# Patient Record
Sex: Male | Born: 1990 | Race: White | Hispanic: No | Marital: Single | Smoking: Current every day smoker
Health system: Southern US, Community
[De-identification: ages and names within clinical notes are randomized; demographics above are authoritative.]

## PROBLEM LIST (undated history)

## (undated) ENCOUNTER — Encounter: Attending: Registered" | Primary: Registered"

## (undated) ENCOUNTER — Ambulatory Visit

## (undated) ENCOUNTER — Encounter

## (undated) ENCOUNTER — Telehealth

## (undated) ENCOUNTER — Telehealth: Attending: Clinical | Primary: Clinical

## (undated) ENCOUNTER — Telehealth: Attending: Registered" | Primary: Registered"

## (undated) ENCOUNTER — Ambulatory Visit: Payer: PRIVATE HEALTH INSURANCE

## (undated) ENCOUNTER — Telehealth: Attending: Otolaryngology | Primary: Otolaryngology

## (undated) ENCOUNTER — Ambulatory Visit: Payer: BLUE CROSS/BLUE SHIELD | Attending: Surgery | Primary: Surgery

## (undated) ENCOUNTER — Ambulatory Visit: Payer: BLUE CROSS/BLUE SHIELD

## (undated) ENCOUNTER — Ambulatory Visit: Attending: Otolaryngology | Primary: Otolaryngology

## (undated) ENCOUNTER — Encounter: Attending: Addiction (Substance Use Disorder) | Primary: Addiction (Substance Use Disorder)

## (undated) ENCOUNTER — Encounter: Attending: Surgery | Primary: Surgery

## (undated) ENCOUNTER — Ambulatory Visit: Payer: PRIVATE HEALTH INSURANCE | Attending: Surgery | Primary: Surgery

## (undated) ENCOUNTER — Ambulatory Visit
Payer: BLUE CROSS/BLUE SHIELD | Attending: Student in an Organized Health Care Education/Training Program | Primary: Student in an Organized Health Care Education/Training Program

## (undated) ENCOUNTER — Encounter: Payer: PRIVATE HEALTH INSURANCE | Attending: Otolaryngology | Primary: Otolaryngology

## (undated) ENCOUNTER — Encounter: Attending: Otolaryngology | Primary: Otolaryngology

## (undated) DIAGNOSIS — M5430 Sciatica, unspecified side: Secondary | ICD-10-CM

## (undated) HISTORY — PX: NO PAST SURGERIES: SHX2092

---
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
## 2012-12-28 ENCOUNTER — Ambulatory Visit: Payer: Self-pay | Admitting: Emergency Medicine

## 2014-01-03 IMAGING — CR DG LUMBAR SPINE COMPLETE 4+V
1 series · 5 of 5 positions shown · non-contrast
Comparison: none

REASON FOR EXAM: back pain left side
COMMENTS:

PROCEDURE:     MDR - M[REDACTED] SPINE WITH OBLIQUES  - December 28, 2012  [DATE]
RESULT:     No acute bony or joint abnormality. Pedicles are intact.
Degenerative changes noted both hips. Linear calcified density noted over
right upper quadrant of questionable etiology.

[Series 1: ap · 0.17mm/px · 5 of 5 slices shown]
[im 1/5]
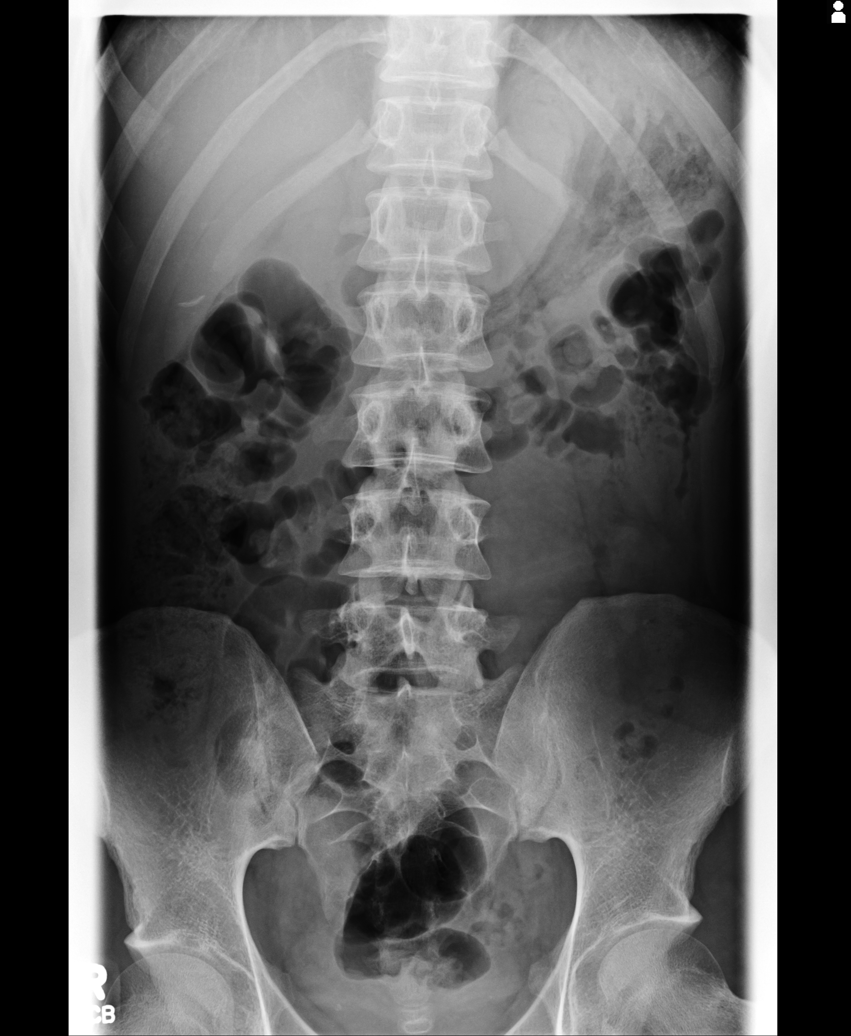
[im 2/5]
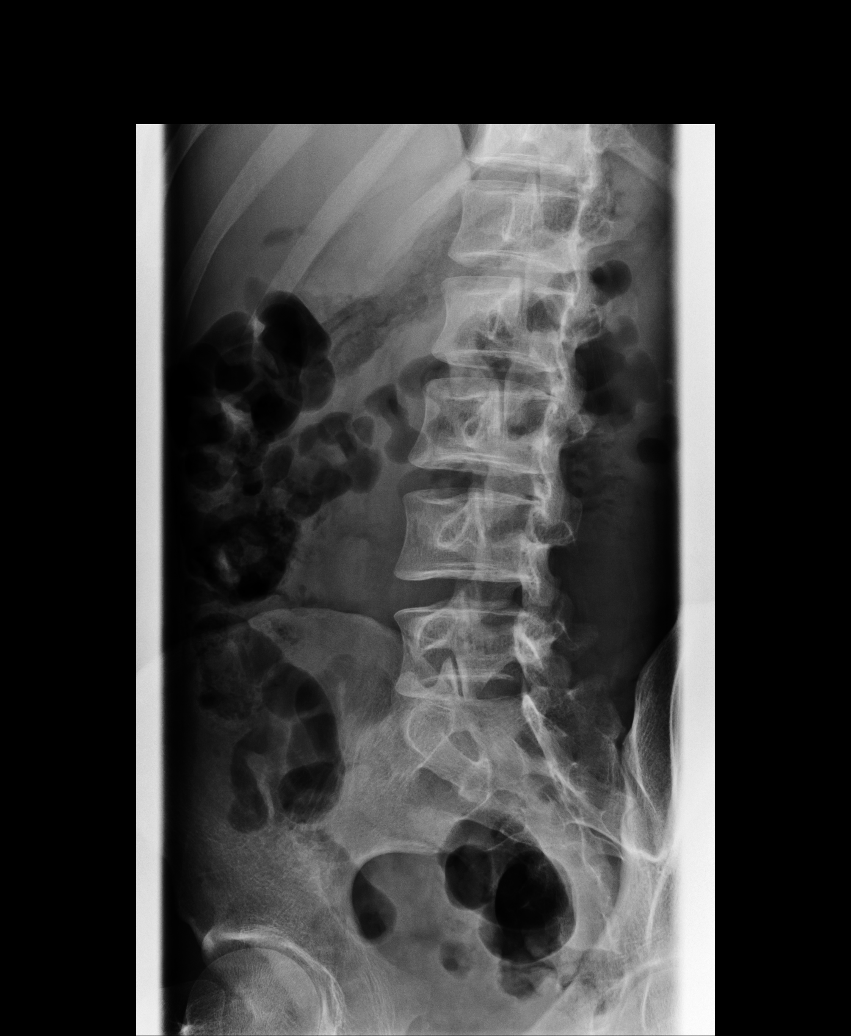
[im 3/5]
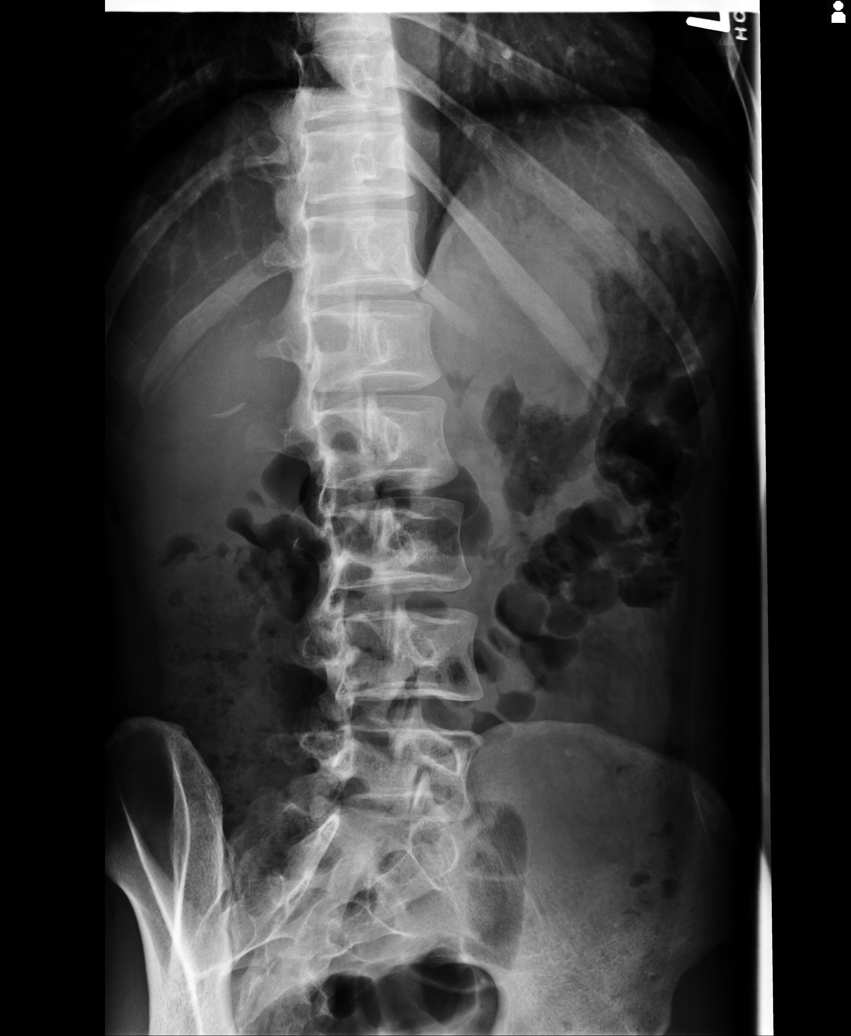
[im 4/5]
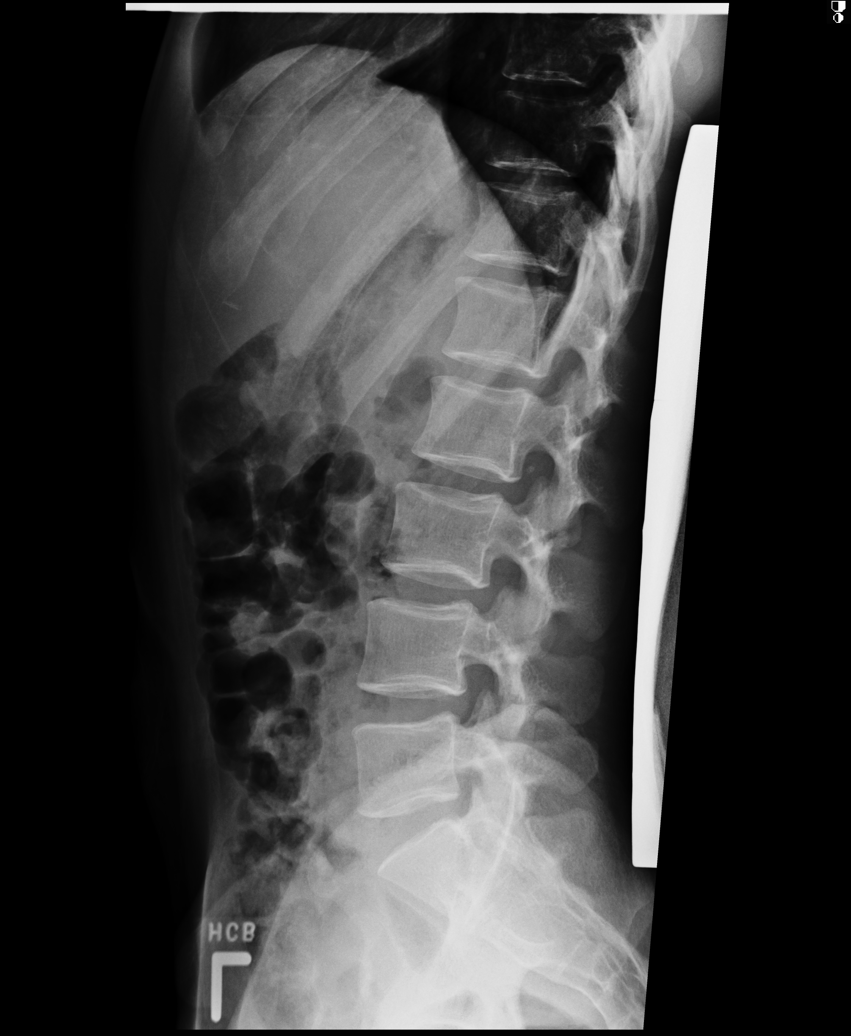
[im 5/5]
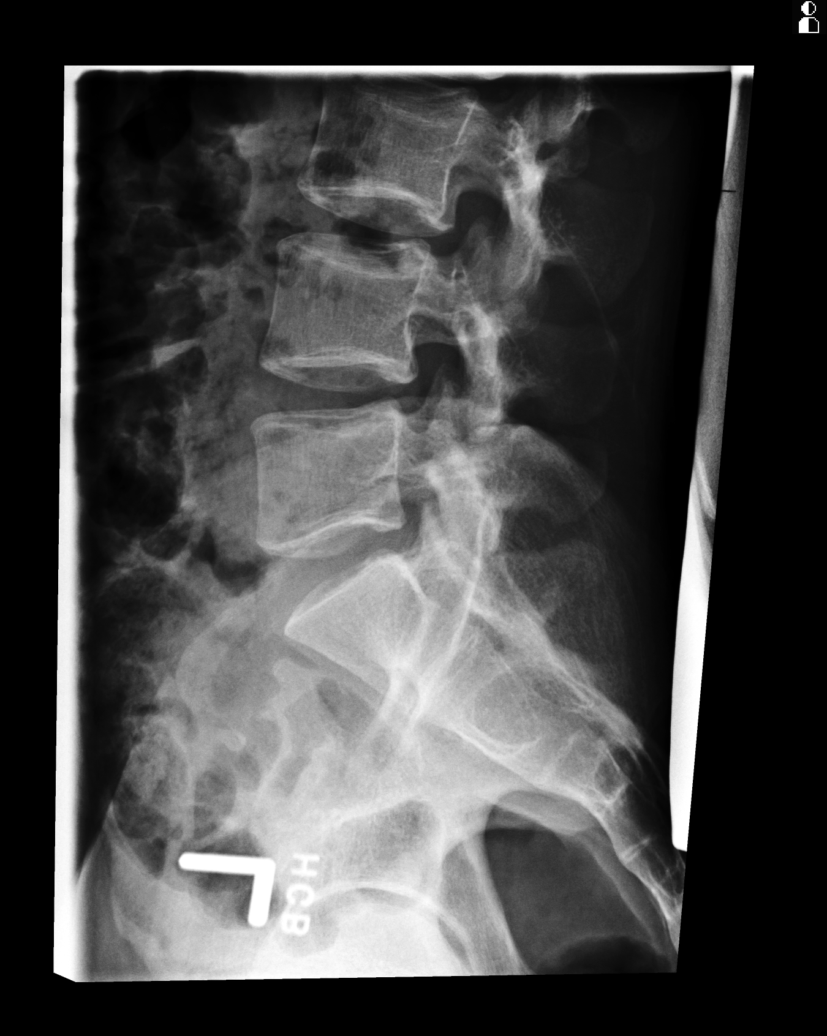

[5 of 5 positions shown; findings below may reference images not displayed]

IMPRESSION: Nonspecific exam.

## 2014-11-17 ENCOUNTER — Ambulatory Visit: Payer: Self-pay | Admitting: Internal Medicine

## 2015-11-09 ENCOUNTER — Ambulatory Visit
Admission: EM | Admit: 2015-11-09 | Discharge: 2015-11-09 | Disposition: A | Discharge status: Home / Self Care | Payer: Federal, State, Local not specified - PPO | Attending: Family Medicine | Admitting: Family Medicine

## 2015-11-09 DIAGNOSIS — M5431 Sciatica, right side: Secondary | ICD-10-CM

## 2015-11-09 HISTORY — DX: Sciatica, unspecified side: M54.30

## 2015-11-09 MED ORDER — MELOXICAM 7.5 MG PO TABS: 7.5000 mg | ORAL_TABLET | Freq: Every day | ORAL | Status: DC

## 2015-11-09 MED ORDER — KETOROLAC TROMETHAMINE 60 MG/2ML IM SOLN
60.0000 mg | Freq: Once | INTRAMUSCULAR | Status: AC
  Administered 2015-11-09: 60 mg via INTRAMUSCULAR

## 2015-11-09 MED ORDER — ORPHENADRINE CITRATE ER 100 MG PO TB12: 100.0000 mg | ORAL_TABLET | Freq: Two times a day (BID) | ORAL | Status: DC

## 2015-11-09 NOTE — ED Provider Notes (Addendum)
CSN: [id number]     Arrival date & time 11/09/15  1319 History   First MD Initiated Contact with Patient 11/09/15 1454     Chief Complaint  Patient presents with  . Leg Pain   (Consider location/radiation/quality/duration/timing/severity/associated sxs/prior Treatment) HPI 24 yo WM with right low back pain. 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 Pivotal weight bearing- also lifts racks of heavy glasses and dishware from washer to racks No upper back pain, no nausea ,fever dysuria . Denies any STD exposure, penile discharge or discomfort. No difficulty w contolling void or BM, no saddle paresthesia reported Has had issues with right thigh discomfort - non today Self admits low tolerance for discomfort and increased anxiety about the discomfort  Past Medical History  Diagnosis Date  . Sciatica    History reviewed. No pertinent past surgical history. History reviewed. No pertinent family history. Social History  Substance Use Topics  . Smoking status: Current Every Day Smoker -- 0.50 packs/day    Types: Cigarettes  . Smokeless tobacco: None  . Alcohol Use: Yes     Comment: occasionally    Review of Systems Constitutional: No fever.  Eyes: No visual changes. ENT:No sore throat. Cardiovascular:Negative for chest pain/palpitations Respiratory: Negative for shortness of breath Gastrointestinal: No abdominal pain. No nausea,vomiting, diarrhea Genitourinary: Negative for dysuria. Normal urination.Denies discharge, irritation,  Musculoskeletal: Positive for right low  back pain. FROM extremities without pain Skin: Negative for rash Neurological: Negative for headache, focal weakness or numbness or tingling Allergies  Review of patient's allergies indicates not on file.  Home Medications   Prior to Admission medications   Not on File   Meds Ordered and Administered  this Visit   Medications  ketorolac (TORADOL) injection 60 mg (not administered)   Addendum requested-: Medication was given IM at 15:18 on 11/09/15 by Elita QuickHeather Michel RN- well tolerated Unable to modify Smart Link at accept information.  LWLee PA-C  Much improved per patient  BP 116/64 mmHg  Pulse 86  Temp(Src) 98.1 F (36.7 C) (Tympanic)  Resp 16  Ht 5\' 7"  (1.702 m)  Wt 149 lb (67.586 kg)  BMI 23.33 kg/m2  SpO2 100% No data found.   Physical Exam   VS noted, WNL GENERAL : NAD,ambulatory ,normal gait, on and off table unassisted, mild low right back discomfort HEENT: grossly WNL, conjugate gaze, grossly normal hearing, FROM head and neck  RESP: CTA  B , no wheezing, no accessory muscle use CARD: RRR ABD: Not distended BACK: Right low back tenderness over sciatic notch; SLR w  slight increased discomfort contralateral lift; Can toe walk with some discomfort,heel walk neg; squat and return without assist; NEURO: Good attention, good recall, no gross neurosensory deficit, DTRs brisk equal bilateral LEs PSYCH: speech and behavior appropriate ED Course  Procedures (including critical care time)  Labs Review Labs Reviewed - No data to display  Imaging Review No results found.  Discussed body mechanics; avoidance of torque /twist/lift  Rethink work processes as the problem is rarely present except when job demands increase Ice paks during break and after work/frozen peas Rx symptoms for acute phase    MDM   1. Sciatica of right side   Plan: Diagnosis and treatment reviewed with patient Rx as per orders;  benefits, risks, potential side effects reviewed   Recommend supportive treatment with cyclic tylenol and ibuprofen, ice, body mechanics  Questions fielded, expectations and recommendations reviewed. Discussed follow up and return parameters including no resolution or any worsening condition..  Patient expresses understanding  and agrees to plan. Will return to  [facility] with questions, concerns or exacerbation.  Sees Dr. Dub Amis for routine care - follow up encouraged for coordination of long term care  Discharge Medication List as of 11/09/2015  3:41 PM    START taking these medications   Details  meloxicam (MOBIC) 7.5 MG tablet Take 1 tablet (7.5 mg total) by mouth daily. After taking 1 tablet twice daily for 3-5 days, Starting 11/09/2015, Until Discontinued, Print    orphenadrine (NORFLEX) 100 MG tablet Take 1 tablet (100 mg total) by mouth 2 (two) times daily., Starting 11/09/2015, Until Discontinued, Print        Addendum- as above - Ketorolac 60 mg was given IM on 11/09/15 by Elita Quick RN -injection well tolerated  and patient became more comfortable.  Have been unable to release Smart Link to accept this addendum. [facility] PA-C  02/17/16   Rae Halsted, PA-C 11/10/15 0981  Rae Halsted, PA-C 02/17/16 [phone]

## 2015-11-09 NOTE — Discharge Instructions (Signed)
Body mechanics will be hugely important for you--avoid twist and lift Weight and torque will aggravate low back  Currently ordering muscle relaxer and anti-inflammatory For 10-14 days Follow up with Dr Yetta BarreJones if there is steady improvement for longer term Rx of her choice Use ice after work shifts  Report back with increased difficulty pain or any of the other symptoms we reviewed  Back Exercises The following exercises strengthen the muscles that help to support the back. They also help to keep the lower back flexible. Doing these exercises can help to prevent back pain or lessen existing pain. If you have back pain or discomfort, try doing these exercises 2-3 times each day or as told by your health care provider. When the pain goes away, do them once each day, but increase the number of times that you repeat the steps for each exercise (do more repetitions). If you do not have back pain or discomfort, do these exercises once each day or as told by your health care provider. EXERCISES Single Knee to Chest Repeat these steps 3-5 times for each leg: 1. Lie on your back on a firm bed or the floor with your legs extended. 2. Bring one knee to your chest. Your other leg should stay extended and in contact with the floor. 3. Hold your knee in place by grabbing your knee or thigh. 4. Pull on your knee until you feel a gentle stretch in your lower back. 5. Hold the stretch for 10-30 seconds. 6. Slowly release and straighten your leg. Pelvic Tilt Repeat these steps 5-10 times: 1. Lie on your back on a firm bed or the floor with your legs extended. 2. Bend your knees so they are pointing toward the ceiling and your feet are flat on the floor. 3. Tighten your lower abdominal muscles to press your lower back against the floor. This motion will tilt your pelvis so your tailbone points up toward the ceiling instead of pointing to your feet or the floor. 4. With gentle tension and even breathing,  hold this position for 5-10 seconds. Cat-Cow Repeat these steps until your lower back becomes more flexible: 1. Get into a hands-and-knees position on a firm surface. Keep your hands under your shoulders, and keep your knees under your hips. You may place padding under your knees for comfort. 2. Let your head hang down, and point your tailbone toward the floor so your lower back becomes rounded like the back of a cat. 3. Hold this position for 5 seconds. 4. Slowly lift your head and point your tailbone up toward the ceiling so your back forms a sagging arch like the back of a cow. 5. Hold this position for 5 seconds. Press-Ups Repeat these steps 5-10 times: 1. Lie on your abdomen (face-down) on the floor. 2. Place your palms near your head, about shoulder-width apart. 3. While you keep your back as relaxed as possible and keep your hips on the floor, slowly straighten your arms to raise the top half of your body and lift your shoulders. Do not use your back muscles to raise your upper torso. You may adjust the placement of your hands to make yourself more comfortable. 4. Hold this position for 5 seconds while you keep your back relaxed. 5. Slowly return to lying flat on the floor. Bridges Repeat these steps 10 times: 1. Lie on your back on a firm surface. 2. Bend your knees so they are pointing toward the ceiling and your feet  are flat on the floor. 3. Tighten your buttocks muscles and lift your buttocks off of the floor until your waist is at almost the same height as your knees. You should feel the muscles working in your buttocks and the back of your thighs. If you do not feel these muscles, slide your feet 1-2 inches farther away from your buttocks. 4. Hold this position for 3-5 seconds. 5. Slowly lower your hips to the starting position, and allow your buttocks muscles to relax completely. If this exercise is too easy, try doing it with your arms crossed over your chest. Abdominal  Crunches Repeat these steps 5-10 times: 1. Lie on your back on a firm bed or the floor with your legs extended. 2. Bend your knees so they are pointing toward the ceiling and your feet are flat on the floor. 3. Cross your arms over your chest. 4. Tip your chin slightly toward your chest without bending your neck. 5. Tighten your abdominal muscles and slowly raise your trunk (torso) high enough to lift your shoulder blades a tiny bit off of the floor. Avoid raising your torso higher than that, because it can put too much stress on your low back and it does not help to strengthen your abdominal muscles. 6. Slowly return to your starting position. Back Lifts Repeat these steps 5-10 times: 1. Lie on your abdomen (face-down) with your arms at your sides, and rest your forehead on the floor. 2. Tighten the muscles in your legs and your buttocks. 3. Slowly lift your chest off of the floor while you keep your hips pressed to the floor. Keep the back of your head in line with the curve in your back. Your eyes should be looking at the floor. 4. Hold this position for 3-5 seconds. 5. Slowly return to your starting position. SEEK MEDICAL CARE IF:  Your back pain or discomfort gets much worse when you do an exercise.  Your back pain or discomfort does not lessen within 2 hours after you exercise. If you have any of these problems, stop doing these exercises right away. Do not do them again unless your health care provider says that you can. SEEK IMMEDIATE MEDICAL CARE IF:  You develop sudden, severe back pain. If this happens, stop doing the exercises right away. Do not do them again unless your health care provider says that you can.   This information is not intended to replace advice given to you by your health care provider. Make sure you discuss any questions you have with your health care provider.   Document Released: 12/14/2004 Document Revised: 07/28/2015 Document Reviewed:  12/31/2014 Elsevier Interactive Patient Education [employer]

## 2015-11-09 NOTE — ED Notes (Signed)
Hx of sciatica. Started 4-5 days ago with coccyx area pain that radiated over right buttock and down front and back of right leg

## 2015-11-10 ENCOUNTER — Encounter: Payer: Self-pay | Admitting: Physician Assistant

## 2016-01-04 ENCOUNTER — Ambulatory Visit (INDEPENDENT_AMBULATORY_CARE_PROVIDER_SITE_OTHER): Payer: Federal, State, Local not specified - PPO | Admitting: Family Medicine

## 2016-01-04 ENCOUNTER — Encounter: Payer: Self-pay | Admitting: Family Medicine

## 2016-01-04 VITALS — BP 110/80 | HR 80 | Temp 99.7°F | Ht 67.0 in | Wt 150.0 lb

## 2016-01-04 DIAGNOSIS — J101 Influenza due to other identified influenza virus with other respiratory manifestations: Secondary | ICD-10-CM | POA: Diagnosis not present

## 2016-01-04 LAB — POCT INFLUENZA A/B
INFLUENZA A, POC: POSITIVE — AB
Influenza B, POC: NEGATIVE

## 2016-01-04 MED ORDER — GUAIFENESIN-CODEINE 100-10 MG/5ML PO SYRP: 5.0000 mL | ORAL_SOLUTION | Freq: Three times a day (TID) | ORAL | Status: DC | PRN

## 2016-01-04 MED ORDER — OSELTAMIVIR PHOSPHATE 75 MG PO CAPS: 75.0000 mg | ORAL_CAPSULE | Freq: Two times a day (BID) | ORAL | Status: DC

## 2016-01-04 NOTE — Progress Notes (Signed)
Name: Timothy King   MRN: [id number]    DOB: 1991-01-08   Date:01/04/2016       Progress Note  Subjective  Chief Complaint  Chief Complaint  Patient presents with  . Sinusitis    stuffy nose, sore throat, myalgias, fever and chills    Sinusitis This is a new problem. The current episode started yesterday. The problem has been gradually worsening since onset. Maximum temperature: "feel like I did" The fever has been present for less than 1 day. Associated symptoms include chills, congestion, coughing, diaphoresis, shortness of breath, sinus pressure and a sore throat. Pertinent negatives include no ear pain, headaches, hoarse voice, neck pain, sneezing or swollen glands. Past treatments include acetaminophen and oral decongestants. The treatment provided no relief.  Cough This is a recurrent problem. The current episode started yesterday. The cough is productive of purulent sputum (yellow). Associated symptoms include chest pain, chills, a sore throat and shortness of breath. Pertinent negatives include no ear pain, fever, headaches, heartburn, myalgias, rash, weight loss or wheezing. There is no history of environmental allergies.    No problem-specific assessment & plan notes found for this encounter.   Past Medical History  Diagnosis Date  . Sciatica     History reviewed. No pertinent past surgical history.  Family History  Problem Relation Age of Onset  . Hyperlipidemia Mother   . Hyperlipidemia Father   . Hypertension Father   . Cancer Paternal Grandmother     Social History   Social History  . Marital Status: Single    Spouse Name: N/A  . Number of Children: N/A  . Years of Education: N/A   Occupational History  . Not on file.   Social History Main Topics  . Smoking status: Current Every Day Smoker -- 0.50 packs/day    Types: Cigarettes  . Smokeless tobacco: Not on file  . Alcohol Use: Yes     Comment: occasionally  . Drug Use: Not on file  .  Sexual Activity: Yes   Other Topics Concern  . Not on file   Social History Narrative    No Known Allergies   Review of Systems  Constitutional: Positive for chills and diaphoresis. Negative for fever, weight loss and malaise/fatigue.  HENT: Positive for congestion, sinus pressure and sore throat. Negative for ear discharge, ear pain, hoarse voice and sneezing.   Eyes: Negative for blurred vision.  Respiratory: Positive for cough and shortness of breath. Negative for sputum production and wheezing.   Cardiovascular: Positive for chest pain. Negative for palpitations and leg swelling.  Gastrointestinal: Negative for heartburn, nausea, abdominal pain, diarrhea, constipation, blood in stool and melena.  Genitourinary: Negative for dysuria, urgency, frequency and hematuria.  Musculoskeletal: Negative for myalgias, back pain, joint pain and neck pain.  Skin: Negative for rash.  Neurological: Negative for dizziness, tingling, sensory change, focal weakness and headaches.  Endo/Heme/Allergies: Negative for environmental allergies and polydipsia. Does not bruise/bleed easily.  Psychiatric/Behavioral: Negative for depression and suicidal ideas. The patient is not nervous/anxious and does not have insomnia.      Objective  Filed Vitals:   01/04/16 1057  BP: 110/80  Pulse: 80  Temp: 99.7 F (37.6 C)  TempSrc: Oral  Height:  (1.702 m)  Weight: 150 lb (68.04 kg)    Physical Exam  Constitutional: He is oriented to person, place, and time and well-developed, well-nourished, and in no distress.  HENT:  Head: Normocephalic.  Right Ear: External ear normal.  Left Ear: External  ear normal.  Nose: Nose normal.  Mouth/Throat: Oropharynx is clear and moist.  Eyes: Conjunctivae and EOM are normal. Pupils are equal, round, and reactive to light. Right eye exhibits no discharge. Left eye exhibits no discharge. No scleral icterus.  Neck: Normal range of motion. Neck supple. No JVD  present. No tracheal deviation present. No thyromegaly present.  Cardiovascular: Normal rate, regular rhythm, normal heart sounds and intact distal pulses.  Exam reveals no gallop and no friction rub.   No murmur heard. Pulmonary/Chest: Breath sounds normal. No respiratory distress. He has no wheezes. He has no rales.  Abdominal: Soft. Bowel sounds are normal. He exhibits no mass. There is no hepatosplenomegaly. There is no tenderness. There is no rebound, no guarding and no CVA tenderness.  Musculoskeletal: Normal range of motion. He exhibits no edema or tenderness.  Lymphadenopathy:    He has no cervical adenopathy.  Neurological: He is alert and oriented to person, place, and time. He has normal sensation, normal strength, normal reflexes and intact cranial nerves. No cranial nerve deficit.  Skin: Skin is warm. No rash noted.  Psychiatric: Mood and affect normal.  Nursing note and vitals reviewed.     Assessment & Plan  Problem List Items Addressed This Visit    None    Visit Diagnoses    Influenza A    -  Primary    Relevant Medications    oseltamivir (TAMIFLU) 75 MG capsule    guaiFENesin-codeine (ROBITUSSIN AC) 100-10 MG/5ML syrup    Other Relevant Orders    POCT Influenza A/B (Completed)         Dr. [facility]  01/04/2016

## 2016-11-01 ENCOUNTER — Encounter (HOSPITAL_COMMUNITY): Payer: Self-pay

## 2016-11-21 ENCOUNTER — Ambulatory Visit: Payer: Federal, State, Local not specified - PPO | Admitting: Licensed Clinical Social Worker

## 2016-12-21 ENCOUNTER — Ambulatory Visit: Payer: Self-pay | Admitting: Psychiatry

## 2018-10-03 ENCOUNTER — Encounter: Payer: Self-pay | Admitting: Family Medicine

## 2018-10-03 ENCOUNTER — Ambulatory Visit (INDEPENDENT_AMBULATORY_CARE_PROVIDER_SITE_OTHER): Payer: Federal, State, Local not specified - PPO | Admitting: Family Medicine

## 2018-10-03 VITALS — BP 120/80 | HR 110 | Temp 100.7°F | Ht 67.0 in | Wt 163.0 lb

## 2018-10-03 DIAGNOSIS — J111 Influenza due to unidentified influenza virus with other respiratory manifestations: Secondary | ICD-10-CM

## 2018-10-03 DIAGNOSIS — R509 Fever, unspecified: Secondary | ICD-10-CM | POA: Diagnosis not present

## 2018-10-03 LAB — POCT INFLUENZA A/B
INFLUENZA B, POC: NEGATIVE
Influenza A, POC: NEGATIVE

## 2018-10-03 MED ORDER — OSELTAMIVIR PHOSPHATE 75 MG PO CAPS: 75.0000 mg | ORAL_CAPSULE | Freq: Two times a day (BID) | ORAL | 0 refills | Status: DC

## 2018-10-03 NOTE — Patient Instructions (Signed)

## 2018-10-03 NOTE — Progress Notes (Signed)
Date:  10/03/2018   Name:  Timothy King   DOB:  [birth year]   MRN:  [id number]   Chief Complaint: Establish Care (started 2 days ago- chills, head "pounding", fever off and on) Fever   This is a new problem. The current episode started in the past 7 days (Tuesday). The problem occurs intermittently. The problem has been gradually worsening. The maximum temperature noted was 100 to 100.9 F. The temperature was taken using an oral thermometer. Associated symptoms include headaches and muscle aches. Pertinent negatives include no abdominal pain, chest pain, congestion, coughing, diarrhea, ear pain, nausea, rash, sleepiness, sore throat, urinary pain, vomiting or wheezing. He has tried acetaminophen for the symptoms. The treatment provided mild relief.  Risk factors: no contaminated food, no contaminated water, no hx of cancer, no immunosuppression, no occupational exposure, no recent sickness, no recent travel and no sick contacts      Review of Systems  Constitutional: Positive for fever. Negative for chills.  HENT: Negative for congestion, drooling, ear discharge, ear pain and sore throat.   Respiratory: Negative for cough, shortness of breath and wheezing.   Cardiovascular: Negative for chest pain, palpitations and leg swelling.  Gastrointestinal: Negative for abdominal pain, blood in stool, constipation, diarrhea, nausea and vomiting.  Endocrine: Negative for polydipsia.  Genitourinary: Negative for dysuria, frequency, hematuria and urgency.  Musculoskeletal: Negative for back pain, myalgias and neck pain.  Skin: Negative for rash.  Allergic/Immunologic: Negative for environmental allergies.  Neurological: Positive for headaches. Negative for dizziness.  Hematological: Does not bruise/bleed easily.  Psychiatric/Behavioral: Negative for suicidal ideas. The patient is not nervous/anxious.     There are no active problems to display for this patient.   No Known  Allergies  History reviewed. No pertinent surgical history.  Social History   Tobacco Use  . Smoking status: Current Every Day Smoker    Packs/day: 0.50    Types: Cigarettes  . Smokeless tobacco: Never Used  . Tobacco comment: offered info- pt not interested in quitting  Substance Use Topics  . Alcohol use: Yes    Comment: occasionally  . Drug use: Never     Medication list has been reviewed and updated.  No outpatient medications have been marked as taking for the 10/03/18 encounter (Office Visit) with Duanne Limerick, MD.    [facility] 2/9 Scores 10/03/2018 01/04/2016  PHQ - 2 Score 0 3  PHQ- 9 Score 0 13    Physical Exam  Constitutional: He is oriented to person, place, and time.  HENT:  Head: Normocephalic.  Right Ear: Hearing, tympanic membrane, external ear and ear canal normal.  Left Ear: Hearing, tympanic membrane, external ear and ear canal normal.  Nose: Nose normal. No mucosal edema. Right sinus exhibits no maxillary sinus tenderness and no frontal sinus tenderness. Left sinus exhibits no maxillary sinus tenderness and no frontal sinus tenderness.  Mouth/Throat: Uvula is midline and oropharynx is clear and moist. No oropharyngeal exudate, posterior oropharyngeal edema or posterior oropharyngeal erythema.  Eyes: Pupils are equal, round, and reactive to light. Conjunctivae and EOM are normal. Right eye exhibits no discharge. Left eye exhibits no discharge. No scleral icterus.  Neck: Normal range of motion. Neck supple. No JVD present. No tracheal deviation present. No thyromegaly present.  Cardiovascular: Normal rate, regular rhythm, normal heart sounds and intact distal pulses. Exam reveals no gallop and no friction rub.  No murmur heard. Pulmonary/Chest: Breath sounds normal. No respiratory distress. He has no decreased breath sounds. He  has no wheezes. He has no rhonchi. He has no rales.  Abdominal: Soft. Bowel sounds are normal. He exhibits no mass. There is no  hepatosplenomegaly. There is no tenderness. There is no rigidity, no rebound, no guarding and no CVA tenderness.  Musculoskeletal: Normal range of motion. He exhibits no edema or tenderness.  Lymphadenopathy:    He has no cervical adenopathy.  Neurological: He is alert and oriented to person, place, and time. He has normal strength and normal reflexes. No cranial nerve deficit.  Skin: Skin is warm and intact. No rash noted. He is not diaphoretic. No erythema. No pallor.  Vitals reviewed.   BP 120/80   Pulse (!) 110   Temp (!) 100.7 F (38.2 C) (Oral)   Ht 5\' 7"  (1.702 m)   Wt 163 lb (73.9 kg)   BMI 25.53 kg/m   Assessment and Plan:  1. Fever and chills Acute Will check for influenza. - oseltamivir (TAMIFLU) 75 MG capsule; Take 1 capsule (75 mg total) by mouth 2 (two) times daily.  Dispense: 10 capsule; Refill: 0 - POCT Influenza A/B  2. Influenza Influenza screen negative Since fever with chills and influenza A is prevalent,will cover with tamiflu 75 mg bid for 5 days - oseltamivir (TAMIFLU) 75 MG capsule; Take 1 capsule (75 mg total) by mouth 2 (two) times daily.  Dispense: 10 capsule; Refill: 0 - POCT Influenza A/B   Dr. Elizabeth Sauereanna Jones [facility]  10/03/2018

## 2018-12-16 ENCOUNTER — Other Ambulatory Visit: Payer: Self-pay

## 2018-12-16 ENCOUNTER — Ambulatory Visit
Admission: EM | Admit: 2018-12-16 | Discharge: 2018-12-16 | Disposition: A | Discharge status: Home / Self Care | Payer: Federal, State, Local not specified - PPO | Attending: Family Medicine | Admitting: Family Medicine

## 2018-12-16 DIAGNOSIS — R112 Nausea with vomiting, unspecified: Secondary | ICD-10-CM | POA: Insufficient documentation

## 2018-12-16 MED ORDER — ONDANSETRON 4 MG PO TBDP: 4.0000 mg | ORAL_TABLET | Freq: Three times a day (TID) | ORAL | 0 refills | Status: AC | PRN

## 2018-12-16 NOTE — Discharge Instructions (Addendum)
Take medication as prescribed. Rest. Drink plenty of fluids. Bland foods.   Follow up with your primary care physician this week as needed. Return to Urgent care for new or worsening concerns.

## 2018-12-16 NOTE — ED Provider Notes (Signed)
[facility] ____________________________________________  Time seen: Approximately 8:13 PM  I have reviewed the triage vital signs and the nursing notes.   HISTORY  Chief Complaint Emesis   HPI Timothy King is a 28 y.o. male presenting for evaluation of nausea and vomiting.  Patient states symptoms started Friday night into Saturday morning and continued throughout Saturday.  States approximately 4-5 episodes of vomiting on Saturday and some into Sunday.  States approximate 2 episodes of vomiting early Sunday morning.  States he did have accompanying abdominal cramping sensation, and reports cramping would improve after vomiting.  Denies diarrhea during this but was not eating as much prior.  Patient reports he did have a bowel movement this morning that he describes as normal.  Denies any abnormal color stool or vomit or blood in stool or vomit.  Denies any current abdominal pain.  States today he is feeling much better but still somewhat nauseated.  Did eat a biscuit this morning and has been drinking water throughout the day without issues.  Denies chest pain, shortness of breath, fevers, rash, current abdominal pain, dysuria.  Denies recent sickness.  Denies any accompanying cough, congestion, sore throat. denies known food trigger.  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  Duanne Limerick, MD: PCP    Past Medical History:  Diagnosis Date  . Sciatica     There are no active problems to display for this patient.   Past Surgical History:  Procedure Laterality Date  . NO PAST SURGERIES       No current facility-administered medications for this encounter.   Current Outpatient Medications:  .  ondansetron (ZOFRAN ODT) 4 MG disintegrating tablet, Take 1 tablet (4 mg total) by mouth every 8 (eight) hours as needed., Disp: 15 tablet, Rfl: 0  Allergies Patient has no known allergies.  Family History  Problem Relation Age of Onset    . Hyperlipidemia Mother   . Hyperlipidemia Father   . Hypertension Father   . Cancer Paternal Grandmother     Social History Social History   Tobacco Use  . Smoking status: Current Every Day Smoker    Packs/day: 0.50    Types: Cigarettes  . Smokeless tobacco: Never Used  . Tobacco comment: offered info- pt not interested in quitting  Substance Use Topics  . Alcohol use: Yes    Comment: occasionally  . Drug use: Never    Review of Systems Constitutional: No fever ENT: No sore throat. Cardiovascular: Denies chest pain. Respiratory: Denies shortness of breath. Gastrointestinal: As above. No diarrhea.  No constipation. Genitourinary: Negative for dysuria. Musculoskeletal: Negative for back pain. Skin: Negative for rash.   ____________________________________________   PHYSICAL EXAM:  VITAL SIGNS: ED Triage Vitals  Enc Vitals Group     BP 12/16/18 1832 113/76     Pulse Rate 12/16/18 1832 74     Resp 12/16/18 1832 16     Temp 12/16/18 1832 98.2 F (36.8 C)     Temp Source 12/16/18 1832 Oral     SpO2 12/16/18 1832 99 %     Weight 12/16/18 1830 155 lb (70.3 kg)     Height 12/16/18 1830 5\' 7"  (1.702 m)     Head Circumference --      Peak Flow --      Pain Score 12/16/18 1830 6     Pain Loc --      Pain Edu? --      Excl. in GC? --  Constitutional: Alert and oriented. Well appearing and in no acute distress. Eyes: Conjunctivae are normal. ENT      Head: Normocephalic and atraumatic.      Nose: No congestion      Mouth/Throat: Mucous membranes are moist.Oropharynx non-erythematous. Neck: No stridor. Supple without meningismus.  Hematological/Lymphatic/Immunilogical: No cervical lymphadenopathy. Cardiovascular: Normal rate, regular rhythm. Grossly normal heart sounds.  Good peripheral circulation. Respiratory: Normal respiratory effort without tachypnea nor retractions. Breath sounds are clear and equal bilaterally. No wheezes, rales,  rhonchi. Gastrointestinal: Soft and nontender. No distention. Normal Bowel sounds. No CVA tenderness. Musculoskeletal: Steady gait.  Changes positions quickly. Neurologic:  Normal speech and language. Speech is normal. No gait instability.  Skin:  Skin is warm, dry and intact. No rash noted. Psychiatric: Mood and affect are normal. Speech and behavior are normal. Patient exhibits appropriate insight and judgment   ___________________________________________   LABS (all labs ordered are listed, but only abnormal results are displayed)  Labs Reviewed - No data to display   PROCEDURES Procedures  ___________________________________________   INITIAL IMPRESSION / ASSESSMENT AND PLAN / ED COURSE  Pertinent labs & imaging results that were available during my care of the patient were reviewed by me and considered in my medical decision making (see chart for details).  Very well-appearing patient.  No acute distress.  Suspect viral gastroenteritis.  Abdomen soft and nontender and reports feeling better.  Discussed supportive care, PRN Zofran.  Encourage rest, fluids and supportive care.  Work note given.Discussed indication, risks and benefits of medications with patient.  Discussed follow up with Primary care physician this week. Discussed follow up and return parameters including no resolution or any worsening concerns. Patient verbalized understanding and agreed to plan.   ____________________________________________   FINAL CLINICAL IMPRESSION(S) / ED DIAGNOSES  Final diagnoses:  Non-intractable vomiting with nausea, unspecified vomiting type     ED Discharge Orders         Ordered    ondansetron (ZOFRAN ODT) 4 MG disintegrating tablet  Every 8 hours PRN     12/16/18 1956           Note: This dictation was prepared with Dragon dictation along with smaller phrase technology. Any transcriptional errors that result from this process are unintentional.         Renford Dills, NP 12/16/18 2108

## 2018-12-16 NOTE — ED Triage Notes (Signed)
Patient complains of nausea and vomiting that started 2-3 days ago.

## 2019-03-11 ENCOUNTER — Encounter
Admit: 2019-03-11 | Discharge: 2019-03-11 | Disposition: A | Discharge status: Home / Self Care | Payer: PRIVATE HEALTH INSURANCE

## 2019-03-11 DIAGNOSIS — R599 Enlarged lymph nodes, unspecified: Secondary | ICD-10-CM | POA: Diagnosis not present

## 2019-03-11 DIAGNOSIS — Z202 Contact with and (suspected) exposure to infections with a predominantly sexual mode of transmission: Secondary | ICD-10-CM | POA: Diagnosis not present

## 2019-03-11 DIAGNOSIS — F17210 Nicotine dependence, cigarettes, uncomplicated: Secondary | ICD-10-CM | POA: Diagnosis not present

## 2019-03-11 DIAGNOSIS — Z113 Encounter for screening for infections with a predominantly sexual mode of transmission: Secondary | ICD-10-CM | POA: Diagnosis not present

## 2019-03-12 ENCOUNTER — Encounter
Admit: 2019-03-12 | Discharge: 2019-03-12 | Disposition: A | Discharge status: Home / Self Care | Payer: PRIVATE HEALTH INSURANCE | Attending: Family

## 2019-03-12 ENCOUNTER — Encounter: Admit: 2019-03-12 | Discharge: 2019-03-13 | Payer: PRIVATE HEALTH INSURANCE

## 2019-03-12 DIAGNOSIS — Z1159 Encounter for screening for other viral diseases: Secondary | ICD-10-CM

## 2019-03-12 DIAGNOSIS — Z111 Encounter for screening for respiratory tuberculosis: Secondary | ICD-10-CM

## 2019-03-12 DIAGNOSIS — Z0184 Encounter for antibody response examination: Secondary | ICD-10-CM

## 2019-03-12 DIAGNOSIS — B20 Human immunodeficiency virus [HIV] disease: Principal | ICD-10-CM

## 2019-03-12 DIAGNOSIS — Z113 Encounter for screening for infections with a predominantly sexual mode of transmission: Secondary | ICD-10-CM

## 2019-03-12 DIAGNOSIS — Z13220 Encounter for screening for lipoid disorders: Secondary | ICD-10-CM

## 2019-03-12 DIAGNOSIS — A529 Late syphilis, unspecified: Secondary | ICD-10-CM

## 2019-03-12 DIAGNOSIS — R590 Localized enlarged lymph nodes: Secondary | ICD-10-CM | POA: Diagnosis not present

## 2019-03-12 DIAGNOSIS — F17210 Nicotine dependence, cigarettes, uncomplicated: Secondary | ICD-10-CM | POA: Diagnosis not present

## 2019-03-12 DIAGNOSIS — Z21 Asymptomatic human immunodeficiency virus [HIV] infection status: Secondary | ICD-10-CM | POA: Diagnosis not present

## 2019-03-12 DIAGNOSIS — A539 Syphilis, unspecified: Secondary | ICD-10-CM | POA: Diagnosis not present

## 2019-03-12 DIAGNOSIS — Z202 Contact with and (suspected) exposure to infections with a predominantly sexual mode of transmission: Secondary | ICD-10-CM | POA: Diagnosis not present

## 2019-03-12 MED ORDER — BICTEGRAVIR 50 MG-EMTRICITABINE 200 MG-TENOFOVIR ALAFENAM 25 MG TABLET: 1 | tablet | Freq: Every day | 2 refills | 0 days | Status: AC

## 2019-03-12 MED ORDER — BICTEGRAVIR 50 MG-EMTRICITABINE 200 MG-TENOFOVIR ALAFENAM 25 MG TABLET
ORAL_TABLET | Freq: Every day | ORAL | 3 refills | 0.00000 days | Status: CP
Start: 2019-03-12 — End: 2019-03-12
  Filled 2019-03-12: qty 30, 30d supply, fill #0

## 2019-03-12 MED FILL — BIKTARVY 50 MG-200 MG-25 MG TABLET: 30 days supply | Qty: 30 | Fill #0 | Status: AC

## 2019-03-21 ENCOUNTER — Ambulatory Visit: Admit: 2019-03-21 | Discharge: 2019-03-22 | Payer: PRIVATE HEALTH INSURANCE

## 2019-03-21 MED ORDER — DOXYCYCLINE HYCLATE 100 MG CAPSULE
ORAL_CAPSULE | Freq: Two times a day (BID) | ORAL | 0 refills | 0.00000 days | Status: CP
Start: 2019-03-21 — End: 2019-03-21

## 2019-03-21 MED ORDER — DOXYCYCLINE HYCLATE 100 MG CAPSULE: 100 mg | capsule | Freq: Two times a day (BID) | 0 refills | 0 days | Status: AC

## 2019-04-16 ENCOUNTER — Encounter: Admit: 2019-04-16 | Discharge: 2019-04-17 | Payer: PRIVATE HEALTH INSURANCE

## 2019-04-16 DIAGNOSIS — A539 Syphilis, unspecified: Secondary | ICD-10-CM

## 2019-04-16 DIAGNOSIS — B20 Human immunodeficiency virus [HIV] disease: Principal | ICD-10-CM

## 2019-08-12 DIAGNOSIS — B20 Human immunodeficiency virus [HIV] disease: Secondary | ICD-10-CM

## 2019-08-13 DIAGNOSIS — B20 Human immunodeficiency virus [HIV] disease: Secondary | ICD-10-CM

## 2019-08-13 MED ORDER — BICTEGRAVIR 50 MG-EMTRICITABINE 200 MG-TENOFOVIR ALAFENAM 25 MG TABLET
ORAL_TABLET | Freq: Every day | ORAL | 2 refills | 30 days | Status: CP
Start: 2019-08-13 — End: 2019-08-18

## 2019-08-18 ENCOUNTER — Encounter: Admit: 2019-08-18 | Discharge: 2019-08-19 | Payer: PRIVATE HEALTH INSURANCE

## 2019-08-18 DIAGNOSIS — B20 Human immunodeficiency virus [HIV] disease: Secondary | ICD-10-CM

## 2019-08-18 DIAGNOSIS — Z113 Encounter for screening for infections with a predominantly sexual mode of transmission: Secondary | ICD-10-CM

## 2019-08-18 DIAGNOSIS — A539 Syphilis, unspecified: Secondary | ICD-10-CM

## 2019-08-18 MED ORDER — BICTEGRAVIR 50 MG-EMTRICITABINE 200 MG-TENOFOVIR ALAFENAM 25 MG TABLET
ORAL_TABLET | Freq: Every day | ORAL | 1 refills | 90.00000 days | Status: CP
Start: 2019-08-18 — End: ?

## 2019-12-23 ENCOUNTER — Encounter: Admit: 2019-12-23 | Discharge: 2019-12-24 | Payer: PRIVATE HEALTH INSURANCE

## 2019-12-23 DIAGNOSIS — Z113 Encounter for screening for infections with a predominantly sexual mode of transmission: Principal | ICD-10-CM

## 2019-12-23 DIAGNOSIS — R768 Other specified abnormal immunological findings in serum: Principal | ICD-10-CM

## 2019-12-23 DIAGNOSIS — B20 Human immunodeficiency virus [HIV] disease: Principal | ICD-10-CM

## 2019-12-23 DIAGNOSIS — Z8619 Personal history of other infectious and parasitic diseases: Principal | ICD-10-CM

## 2019-12-23 DIAGNOSIS — R590 Localized enlarged lymph nodes: Principal | ICD-10-CM

## 2019-12-23 MED ORDER — BICTEGRAVIR 50 MG-EMTRICITABINE 200 MG-TENOFOVIR ALAFENAM 25 MG TABLET
ORAL_TABLET | Freq: Every day | ORAL | 1 refills | 90 days | Status: CP
Start: 2019-12-23 — End: ?

## 2020-02-04 DIAGNOSIS — B20 Human immunodeficiency virus [HIV] disease: Principal | ICD-10-CM

## 2020-02-04 MED ORDER — BICTEGRAVIR 50 MG-EMTRICITABINE 200 MG-TENOFOVIR ALAFENAM 25 MG TABLET
ORAL_TABLET | Freq: Every day | ORAL | 1 refills | 90.00000 days | Status: CP
Start: 2020-02-04 — End: ?
  Filled 2020-02-05: qty 30, 30d supply, fill #0

## 2020-02-05 MED FILL — BIKTARVY 50 MG-200 MG-25 MG TABLET: 30 days supply | Qty: 30 | Fill #0 | Status: AC

## 2020-02-05 NOTE — Unmapped (Signed)
Received email about patient having issues signing up for [facility] Copay assistance. Signed patient up for copay assistance through [employer]. Emailed patient copy of this information. Advised patient to contact benefits if patient runs into other issues getting medication.    Dorena Cookey  [facility] ID Benefits Coordinator    Time of intervention: 15 mins

## 2020-02-05 NOTE — Unmapped (Signed)
[facility]   Patient Onboarding/Medication Counseling     Kevin [name] is a 29 y.o. male with HIV who I am counseling today on continuation of therapy.  I am speaking to the patient's family member, Kevin [name] (father).    Was a Nurse, learning disability used for this call? No    Verified patient's date of birth / HIPAA.    Specialty medication(s) to be sent: Infectious Disease: Biktarvy      Non-specialty medications/supplies to be sent: n/a      Medications not needed at this time: n/a         Biktarvy (bictegravir, emtricitabine, and tenofovir alafenamide)    The patient declined counseling on medication administration, missed dose instructions, goals of therapy, side effects and monitoring parameters, warnings and precautions, drug/food interactions and storage, handling precautions, and disposal because they have taken the medication previously. The information in the declined sections below are for informational purposes only and was not discussed with patient.       Medication & Administration     Dosage: Take 1 tablet by mouth daily    Administration: Take without regard to food    Adherence/Missed dose instructions: take missed dose as soon as you remember. If it is close to the time of your next dose, skip the dose and resume with your next scheduled dose.    Goals of Therapy     To suppress viral replication and keep patient's HIV undetectable by lab tests    Side Effects & Monitoring Parameters     Common Side Effects:  ? Diarrhea  ? Upset stomach  ? Headache  ? Changes in Weight  ? Changes in mood    The following side effects should be reported to the provider:     ?? If patient experiences: signs of an allergic reaction (rash; hives; itching; red, swollen, blistered, or peeling skin with or without fever; wheezing; tightness in the chest or throat; trouble breathing, swallowing, or talking; unusual hoarseness; or swelling of the mouth, face, lips, tongue, or throat)  ?? signs of kidney problems (unable to pass urine, change in how much urine is passed, blood in the urine, or a big weight gain)  ?? signs of liver problems (dark urine, feeling tired, not hungry, upset stomach or stomach pain, light-colored stools, throwing up, or yellow skin or eyes)  ?? signs of lactic acidosis (fast breathing, fast heartbeat, a heartbeat that does not feel normal, very bad upset stomach or throwing up, feeling very sleepy, shortness of breath, feeling very tired or weak, very bad dizziness, feeling cold, or muscle pain or cramps)  Weight gain: some patients have reported weight gain after starting this medication. The amount of weight can vary.    Monitoring Parameters:  ?? CD4  Count  ?? HIV RNA plasma levels,  ?? Liver function  ?? Total bilirubin  ?? serum creatinine  ?? urine glucose  ?? urine protein (prior to or when initiating therapy and as clinically indicated during therapy);       Drug/Food Interactions     ? Medication list reviewed in Epic. The patient was instructed to inform the care team before taking any new medications or supplements. No drug interactions identified.   ? Calcium Salts: May decrease the serum concentration of Biktarvy. If taken with food, Biktarvy can be administered with calcium salts. If taken on an empty stomach, separate doses by at least 2 hours.   ? Iron Preparations: May decrease the serum  concentration of Biktarvy. If taken with food, Biktarvy can be administered with Ferrous sulfate. If taken on an empty stomach, separate doses by at least 2 hours. Avoid other iron salts.    Contraindications, Warnings, & Precautions     ? Black Box Warning: Severe acute exacertbations of HBV have been reported in patients coinfected with HIV-1 and HBV fllowing discontinuation of therapy  ? Coadministration with dofetilide, rifampin is contraindicated  ? Immune reconstitution syndrome: Patients may develop immune reconstitution syndrome, resulting in the occurrence of an inflammatory response to an indolent or residual opportunistic infection or activation of autoimmune disorders (eg, Graves disease, polymyositis, Guillain-Barr?? syndrome, autoimmune hepatitis)   ? Lactic acidosis/hepatomegaly  ? Renal toxicity: patients with preexisting renal impairment and those taking nephrotoxic agents (including NSAIDs) are at increased risk.     Storage, Handling Precautions, & Disposal     ? Store in the original container at room temperature.   ? Keep lid tightly closed.   ? Store in a dry place. Do not store in a bathroom.   ? Keep all drugs in a safe place. Keep all drugs out of the reach of children and pets.   ? Throw away unused or expired drugs. Do not flush down a toilet or pour down a drain unless you are told to do so. Check with your pharmacist if you have questions about the best way to throw out drugs. There may be drug take-back programs in your area.        Current Medications (including OTC/herbals), Comorbidities and Allergies     Current Outpatient Medications   Medication Sig Dispense Refill   ??? bictegrav-emtricit-tenofov ala (BIKTARVY) 50-200-25 mg tablet Take 1 tablet by mouth daily. 90 tablet 1     No current facility-administered medications for this visit.        No Known Allergies    Patient Active Problem List   Diagnosis   ??? HIV disease (CMS-HCC)   ??? Syphilis       Reviewed and up to date in Epic.    Appropriateness of Therapy     Is medication and dose appropriate based on diagnosis? Yes    Prescription has been clinically reviewed: Yes    Baseline Quality of Life Assessment      How many days over the past month did your HIV  keep you from your normal activities? For example, brushing your teeth or getting up in the morning. 0    Financial Information     Medication Assistance provided: Copay Assistance    Anticipated copay of $0.00 reviewed with patient's father   Verified delivery address.    Delivery Information     Scheduled delivery date: 02/05/20    Expected start date: 02/05/20 Medication will be delivered via Same Day Courier to the prescription address in [facility].  This shipment will not require a signature.      Explained the services we provide at [facility] and that each month we would call to set up refills.  Stressed importance of returning phone calls so that we could ensure they receive their medications in time each month.  Informed patient that we should be setting up refills 7-10 days prior to when they will run out of medication.  A pharmacist will reach out to perform a clinical assessment periodically.  Informed patient that a welcome packet and a drug information handout will be sent.      Patient verbalized understanding of the  above information as well as how to contact the pharmacy at [phone] option 4 with any questions/concerns.  The pharmacy is open Monday through Friday 8:30am-4:30pm.  A pharmacist is available 24/7 via pager to answer any clinical questions they may have.    Patient Specific Needs     ? Does the patient have any physical, cognitive, or cultural barriers? No    ? Patient prefers to have medications discussed with  Family Member Kevin [name] (father)    ? Is the patient or caregiver able to read and understand education materials at a high school level or above? Yes    ? Patient's primary language is  English     ? Is the patient high risk? No     ? Does the patient require a Care Management Plan? No     ? Does the patient require physician intervention or other additional services (i.e. nutrition, smoking cessation, social work)? No      Roderic Palau  [facility] Specialty Pharmacist

## 2020-02-05 NOTE — Unmapped (Signed)
[facility] Specialty Medication Onboarding    Specialty Medication: Biktarvy tablets  Prior Authorization: Approved   Financial Assistance: Yes - copay card approved as secondary   Final Copay/Day Supply: $0.00 / 30    Insurance Restrictions: Yes - max 1 month supply     Notes to Pharmacist: N/A    The triage team has completed the benefits investigation and has determined that the patient is able to fill this medication at [facility]. Please contact the patient to complete the onboarding or follow up with the prescribing physician as needed.

## 2020-02-10 NOTE — Unmapped (Signed)
Patient requested call to discuss labs.  I spoke to patient. He reports he will take medication in the morning but if he misses a dose will take it at night then switch to nightly dosing then back to morning dosing if he ever misses a nightly dose. Reports occasional missed doses as well, which could be contributing to his low level viremia. Denies any regular use of supplements, herbs, vitamins. Occasionally will take gummy vitamin. We discussed spacing from [place].  He will return in 2 weeks for visit and labs.

## 2020-02-10 NOTE — Unmapped (Signed)
Spoke with patient on phone. After verifying name and DOB, discussed need for earlier return visit as his last viral load was detectable. Discussed safe sex practices with detectable viral load.  Return visit rescheduled for 02/26/20

## 2020-02-26 ENCOUNTER — Ambulatory Visit: Admit: 2020-02-26 | Discharge: 2020-02-26 | Payer: PRIVATE HEALTH INSURANCE

## 2020-02-26 DIAGNOSIS — A630 Anogenital (venereal) warts: Principal | ICD-10-CM

## 2020-02-26 DIAGNOSIS — Z113 Encounter for screening for infections with a predominantly sexual mode of transmission: Principal | ICD-10-CM

## 2020-02-26 DIAGNOSIS — B20 Human immunodeficiency virus [HIV] disease: Principal | ICD-10-CM

## 2020-02-26 LAB — HIV RNA COMMENT: Lab: 0

## 2020-02-26 LAB — HIV RNA, QUANTITATIVE, PCR

## 2020-02-26 NOTE — Unmapped (Signed)
Duration of Intervention: 10 min     SW met with patient during ID clinic appointment face to face.  Patient had to leave to go to work but was interested in getting connected with services due to anxiety over health which he disclosed he has been using alcohol to help drown out the anxiety.  SW provided patient with contact information and agreed to call patient Friday morning with information regarding ongoing symptoms and alcohol use.     Patient has insurance and social worker looked up places that cover his insurance. Places are listed below. SW will call patient tomorrow morning for updates.      [facility]  [address]  [address]  [place], [place] [address]  [phone]    [facility]  [address]   [address]  [place], [place] [address]  [phone]    [facility]  [address]  [place], [place] [address]  [phone]    [facility]   [address]   [place], [place] [address]  [phone]    Anson [place] Anon Raices, LCAS  ID Clinic Social Work   Direct: [phone]

## 2020-02-26 NOTE — Unmapped (Addendum)
It was great to see you today.    -Continue Biktarvy every day  - For the bumps on your bottom, apply Aldara cream three times a week (Every other day). Apply it to the bumps at night and leave it on for 8-10 hours, then wash it off with soap and water the next morning. I am also referring you to a surgeon in case you need these removed surgically.  - You will meet with the social worker today to talk about therapy or connecting you with a psychiatrist.  - Labs today  - Follow-up in 3-4 months      Please note:  New Clinic Location at [facility]: [address] , [address] [place], [place] -  [address]    Contacting us   During working hours  [phone]  After hours or weekends [phone] and ask for the ID doctor on call  Fax number   [phone]    MEDICATIONS  For refills, please contact your pharmacy and ask them to electronically send or fax the request to the clinic.     Please bring all medications in original bottles to every appointment.    [facility] (formerly [employer]) or [employer] Eligibility (required even if you do not receive medication through [facility])  Please remember to renew your [facility] eligibility during renewal periods which occur twice a year: January-March and July-September.     The following are needed for each renewal:   - [facility] Identification (if you don't have one, then a bill with your name and address in [place])   - proof of income (award letter, W-2, or last three check stubs)   If you are unable to come in for renewal, let us know if we can mail, fax or e-mail paperwork to you.   [facility] Contact: [phone]      Urgent Care Clinic  Monday, Tuesday, and Thursday from 8:30 - 12 noon  Please call ahead to speak with the nursing staff if you think you need to be seen urgently!    Lab info:  Your most recent CD4 T-cell counts and viral loads are below. Here are a few things to keep in mind when looking at your numbers:  ?? For most people, we're checking CD4 counts fairly infrequently (once a year or less)  ?? It's normal for your CD4 count to be different from visit to visit.   ?? We consider your viral load to be undetectable if it says <40 or if it says Not detected.  ?? Our goal is to get your virus to be undetectable and keep it undetectable. You can help by taking your medications at about the same time, every single day. If you're having trouble with taking your medications, it's important to let us know.    YOUR RECENT LAB RESULTS:  CD4 and VIRAL LOAD  Lab Results   Component Value Date    ACD4 546 12/23/2019

## 2020-02-26 NOTE — Unmapped (Signed)
Infectious Diseases Clinic        Assessment/Plan:     HIV  - Seen 12/23/19 with low level viremia on Biktarvy, likely in the setting of taking ART at variable times of day. He now reports daily morning dosing without any missed doses. No concomitant medications/herbs/supplements.  - Genotyping of RT/PI/INSTI classes without any significant drug associated mutations.  - We reviewed how to take medications and importance of NOT intermittently interrupting medications.  -We discussed importance of separating administration of integrase inhibitor from dietary supplements and antacids.    Lab Results   Component Value Date    ACD4 546 12/23/2019    CD4 22 (L) 12/23/2019    HIVCP 86 (H) 12/23/2019     ? Continue current therapy  ? HIV RNA today  ? Discussed specific strategies to improve ARV adherence.    Anal Condyloma  - 4 week history of bump on anus patient has been treating as an external hemorrhoid. On exam, collection of anal condyloma including one large lesion ~1cm in size.   - Discussed diagnosis, treatment options, role of immunosuppression on occurrence of warts.  - Will rx imiquimod for use 3x week, discussed administration (leave on for 8-10 hours, wash off with soap and water, use sparingly if irritation occurs). Avoid shaving perianal area.   - Larger lesion may need surgical excision, so referral placed to GI surgery.    Mental Health  - Continues to struggle with HIV diagnosis and reports significant anxiety around diagnosis and his health. Reports history of depression as well, and use of alcohol multiple times weekly to help deal with anxiety.  - Referred to our SW/Substance Use counselor today. He would like therapy as well as referral to psychiatry.     History of Late Latent Syphilis  -Diagnosed with syphilis 03/12/19- RPR with 1:4 titer, TP-PA positive. Per state DIS, syphilis testing non-reactive 01/25/2017. He reported shortness of breath and vomiting after penicillin shot in ED, so completed treatment of late latent syphilis with Doxycyline in late May.   - RPR 12/23/19 nonreactive. Recheck RPR at 12, 18, and 24 months after therapy per CDC guidelines.    Sexual health & secondary prevention  Sex with cis men.  Single, new casual partner(s).     Lab Results   Component Value Date    RPR Nonreactive 12/23/2019    CTNAA Negative 12/23/2019    CTNAA Negative 12/23/2019    CTNAA Negative 12/23/2019    GCNAA Negative 12/23/2019    GCNAA Negative 12/23/2019    GCNAA Negative 12/23/2019    SPECSOURCE Urine 12/23/2019    SPECSOURCE Throat 12/23/2019    SPECSOURCE Rectum 12/23/2019     ? GC/CT NAATs -- obtained today  ? RPR -- Repeated today.     Health maintenance    Wt Readings from Last 5 Encounters:   02/26/20 78 kg (172 lb)   12/23/19 79.6 kg (175 lb 8 oz)   03/12/19 73.2 kg (161 lb 6.4 oz)     Oral health  He does not have a dentist. Last dental exam more than 12 months ago.    Eye health  He does not use corrective lenses. Last eye exam more than 12 months ago.    Metabolic conditions  Lab Results   Component Value Date    CREATININE 0.94 12/23/2019    GLUCOSEU Negative 03/12/2019    A1C 5.0 03/12/2019     # Diabetes - HgA1C=5.0 as of 03/12/19  # Kidney  health - UA needed but deferred to future visit  # Bone health - assessment not yet needed    Communicable diseases  Lab Results   Component Value Date    QFTTBGOLD Negative 03/12/2019    HEPCAB Reactive (A) 03/12/2019    HCVRNA Not Detected 12/23/2019     # TB screening - no longer needed; negative IGRA, low risk  # HCV screening - Hep C Ab positive but Hep C RNA not detected 02/2019. Rescreen with RNA q 1-2 years    Cardiovascular disease  Lab Results   Component Value Date    CHOL 169 03/12/2019    HDL 26 (L) 03/12/2019    LDL 123 (H) 03/12/2019    NONHDL 143 03/12/2019    TRIG 100 03/12/2019       There is no immunization history on file for this patient.   We discussed recommended immunizations, including Prevnar and HPV vaccine (especially in light of recent anal condyloma). He declines all vaccines today but plans to make a nurse vaccination visit.    Lab Results   Component Value Date    HEPAIGG Nonreactive 03/12/2019    HEPBSAB Nonreactive 03/12/2019     Counseling services took 15 minutes of today's visit time.    Disposition    Return to clinic 3-4 months or sooner if needed.    Kevin Rocker, Kevin [name]  [facility]   [address] 1st floor   [place], [place]. [address]   Phone: [phone]   Fax: [phone]            Chief Complaint   Follow-up HIV      HPI  In addition to details in A&P above:    Diagnosed with HIV 03/12/2019 and started on Biktarvy shortly after. His CD4 at diagnosis was 241/20% and VL was 34,583.     Kevin [name] returns today for HIV follow-up. At his last visit in February he had low level viremia (HIV RNA=86). He reported occasionally changing the time of day he was taking Biktarvy (switching between evening and morning).  Since that visit, he has been taking Biktarvy in the morning without any missed doses. He does not use any herbs, supplements, vitamins or other medications.    He is worried about a bump on his anus that he thinks is an external hemorrhoid. He noticed it 4 weeks ago and used preparation H but it still is bothersome. It does not bleed, but is itchy and occasionally painful. He reports a history of internal hemorrhoids but has not ever felt any hemorrhoids externally. He denies any recent constipation, straining. He has receptive anal sex.    He also reports significant anxiety around his HIV diagnosis that has not improved over the last year. He finds himself thinking about his diagnosis and getting overwhelmed and feeling down on himself. He reports a history of depression. He also reports significant alcohol use and feels a spiral when he drinks and thinks about HIV and his future. He denies any SI/HI today. He would like to get connected to a therapist and psychiatrist.     He hasn't followed up with ENT and reports the lymph node in his left neck is still present and hasn't gone down.   Past Medical History:   Diagnosis Date   ??? HIV disease (CMS-HCC)    ??? Syphilis      Social History  Housing - in house in [place] with his best friend  School / Work -  Not in school. Employed ([employer])    Tobacco - He reports that he has been smoking e-cigarettes. He has been smoking about 0.25 packs per day. He has never used smokeless tobacco.     Substance use - previous cocaine    Review of Systems  As per HPI. All others negative.      Medications and Allergies  He has a current medication list which includes the following prescription(s): bictegrav-emtricit-tenofov ala.    Allergies: Patient has no known allergies.    Family History  His family history includes Alzheimer's disease in his maternal grandfather and paternal grandfather; Crohn's disease in his father; Diabetes in his maternal grandmother; Heart failure in his maternal grandmother; Lymphoma in his paternal grandmother.           BP 143/84 (BP Site: L Arm, BP Position: Sitting, BP Cuff Size: Medium)  - Pulse 75  - Temp 36.1 ??C (97 ??F) (Temporal)  - Wt 78 kg (172 lb)  - BMI 26.94 kg/m??      Const looks well and attentive, alert, appropriate   Eyes sclerae anicteric, noninjected OU   ENT dentition good and no thrush, leukoplakia or oral lesions   Lymph 1 cm mobile, nontender anterior cervical LAD.    CV RRR. No murmurs. No rub or gallop. S1/S2.   Resp CTAB ant/post, normal work of breathing and no clubbing or cyanosis   GU deferred   Rectal circumferential condylomatous lesions near anus including one larger lesion ~1cm in size. Non tender.    Skin no petechiae, ecchymoses or obvious rashes on clothed exam   MSK no joint tenderness and normal ROM throughout   Neuro CN II-XII grossly intact, MAEE, non focal   Psych Appropriate affect. Eye contact good. Linear thoughts. Fluent speech.     [id number]

## 2020-02-27 LAB — SYPHILIS RPR SCREEN: Reagin Ab:PrThr:Pt:Ser:Ord:RPR: NONREACTIVE

## 2020-02-27 MED ORDER — IMIQUIMOD 5 % TOPICAL CREAM PACKET
PACK | TOPICAL | 2 refills | 28.00000 days | Status: CP
Start: 2020-02-27 — End: 2020-05-21

## 2020-02-27 NOTE — Unmapped (Signed)
Duration of Intervention: 10 min   SW called patient to follow up with previous ID clinic appointment about getting connected with behavioral health treatment. Phone call went straight to voicemail that was full and was unable to leave a message.      Later, patient called back while he was at work.  Patient agreed that social worker could email him a list of mental health providers that would be covered with his insurance. SW emailed and my charted list of providers to the patient.  Patient also brought up concerns regarding medication and needing approval from his provider, Renato Shin. SW reached out to provider about additional information to follow up with. Patient agreed to call this social worker later in the day for an update when he is not at work.       Toiya Morrish LCSWA, LCAS  ID Clinic Social Work   Direct: [phone]

## 2020-02-27 NOTE — Unmapped (Signed)
Kevin [name]    Specialty Medication(s) to be Shipped:   Infectious Disease: Biktarvy    Other medication(s) to be shipped: n/a     Kevin [name], DOB: December 16, 1990  Phone: [phone] (home)       All above HIPAA information was verified with patient's family member, father, [name].     Was a Nurse, learning disability used for this call? No    Completed refill call assessment today to schedule patient's medication shipment from the [facility] [phone]).       Specialty medication(s) and dose(s) confirmed: Regimen is correct and unchanged.   Changes to medications: Norm reports no changes at this time.  Changes to insurance: No  Questions for the pharmacist: No    Confirmed patient received Welcome Packet with first shipment. The patient will receive a drug information handout for each medication shipped and additional FDA Medication Guides as required.       DISEASE/MEDICATION-SPECIFIC INFORMATION        N/A    SPECIALTY MEDICATION ADHERENCE     Medication Adherence    Patient reported X missed doses in the last month: 0  Specialty Medication: biktarvy                biktarvy  : 9 days of medicine on hand         SHIPPING     Shipping address confirmed in Epic.     Delivery Scheduled: Yes, Expected medication delivery date: 4/14.     Medication will be delivered via Next Day Courier to the prescription address in Epic WAM.    Westley Gambles   [facility] Specialty Technician

## 2020-03-02 MED FILL — BIKTARVY 50 MG-200 MG-25 MG TABLET: ORAL | 30 days supply | Qty: 30 | Fill #1

## 2020-03-02 MED FILL — BIKTARVY 50 MG-200 MG-25 MG TABLET: 30 days supply | Qty: 30 | Fill #1 | Status: AC

## 2020-03-04 DIAGNOSIS — A630 Anogenital (venereal) warts: Principal | ICD-10-CM

## 2020-03-04 DIAGNOSIS — B20 Human immunodeficiency virus [HIV] disease: Principal | ICD-10-CM

## 2020-03-16 MED ORDER — IMIQUIMOD 5 % TOPICAL CREAM PACKET: 1 | packet | 2 refills | 28 days | Status: AC

## 2020-03-17 MED ORDER — IMIQUIMOD 5 % TOPICAL CREAM PACKET
PACK | TOPICAL | 2 refills | 28.00000 days | Status: CP
Start: 2020-03-17 — End: 2020-03-16

## 2020-03-17 NOTE — Unmapped (Signed)
Addended byMicheline Maze, Jilliane Kazanjian on: 03/16/2020 06:19 PM     Modules accepted: Orders

## 2020-03-17 NOTE — Unmapped (Signed)
Addended byMicheline Maze, Verlan Grotz on: 03/16/2020 06:18 PM     Modules accepted: Orders

## 2020-03-19 DIAGNOSIS — B20 Human immunodeficiency virus [HIV] disease: Principal | ICD-10-CM

## 2020-03-19 DIAGNOSIS — A630 Anogenital (venereal) warts: Principal | ICD-10-CM

## 2020-03-22 ENCOUNTER — Encounter
Admit: 2020-03-22 | Discharge: 2020-03-23 | Payer: PRIVATE HEALTH INSURANCE | Attending: Otolaryngology | Primary: Otolaryngology

## 2020-03-22 DIAGNOSIS — R221 Localized swelling, mass and lump, neck: Principal | ICD-10-CM

## 2020-03-22 DIAGNOSIS — R590 Localized enlarged lymph nodes: Principal | ICD-10-CM

## 2020-03-22 NOTE — Unmapped (Signed)
History and Physical     Assessment:      29 y.o. year old male with a 1 year history of a left neck mass.  He first noticed this about 11 or 12 months ago and initially drained it with a needle on his own.  It recurred and has not really changed much since then.  He has had no dysphagia, odynophagia, hoarseness, hemoptysis, weight loss or other neck masses.    Physical exam today, including office ultrasound exam, this is a 1.2 cm cystic mass associated directly with the overlying skin most consistent with an epidermal inclusion cyst.  I have told him that this could be safely observed, however, he does not like its appearance or presence in his neck and desires to have it removed.    The risks, benefits, and alternatives of excision of the left neck lesion were discussed including the risk for chipped teeth, bleeding, infection, and damage to normal structures such as nerves and vessels. Specifically the risk of the final pathology showing something that would require further treatment was discussed. He has had a chance to ask questions and have those questions answered.  He understands and wishes to proceed with the surgery.         Plan:      Schedule for excision under local anesthesia with sedation on an outpatient basis     Subjective:       Reason for visit:    Chief Complaint   Patient presents with   ??? New Diagnosis   1 year history of left neck mass    History of Present Illness:  Kevin [name] is a 29 y.o. year old male seen in consultation at the request of Micheline Maze, Roque Lias,* for the evaluation of a 1 year history of a left neck mass.       I reviewed and summarized previous medical records from [facility] which illustrated recent diagnosis of HIV and syphilis.    Objective:   Objective   Past Medical History:  Past Medical History:   Diagnosis Date   ??? HIV disease (CMS-HCC)    ??? Syphilis        Past Surgical History:.  Past Surgical History:   Procedure Laterality Date   ??? DENTAL SURGERY Medications:    Current Outpatient Medications:   ???  bictegrav-emtricit-tenofov ala (BIKTARVY) 50-200-25 mg tablet, Take 1 tablet by mouth daily., Disp: 90 tablet, Rfl: 1  ???  imiquimod (ALDARA) 5 % cream, Apply 1 application topically 3 (three) times a week. (Patient not taking: Reported on 03/22/2020), Disp: 12 packet, Rfl: 2    Allergies:  No Known Allergies    Family History:  Family History   Problem Relation Age of Onset   ??? Crohn's disease Father    ??? Diabetes Maternal Grandmother    ??? Heart failure Maternal Grandmother    ??? Alzheimer's disease Maternal Grandfather    ??? Lymphoma Paternal Grandmother    ??? Alzheimer's disease Paternal Grandfather        Social History:  Social History     Tobacco Use   ??? Smoking status: Current Every Day Smoker     Packs/day: 0.25     Types: e-Cigarettes   ??? Smokeless tobacco: Never Used   ??? Tobacco comment: social, vaping   Substance Use Topics   ??? Alcohol use: Yes     Alcohol/week: 75.0 standard drinks     Types: 75 Shots of liquor per week   ???  Drug use: Yes     Frequency: 7.0 times per week     Types: Cocaine, Marijuana     Comment: xanax and cocaine occasionally     Social History     Tobacco Use   Smoking Status Current Every Day Smoker   ??? Packs/day: 0.25   ??? Types: e-Cigarettes   Smokeless Tobacco Never Used   Tobacco Comment    social, vaping       Review of Systems:  The patient denies dysphagia, odynophagia, weight loss, dyspnea, hoarseness, or hemoptysis.    The patient denies other significant cardiorespiratory, gastrointestinal, oral, pharyngeal, laryngeal, genitourinary, musculoskeletal, soft tissue, neurological, ophthalmic, otologic, sinonasal, skin, psychiatric, or constitutional problems other than as mentioned above.    12 system review of systems was performed today and negative except for the pertinent positives and negatives noted in the HPI.      Physical Exam:   VITAL SIGNS: BP 130/79 (BP Site: R Arm, BP Position: Sitting, BP Cuff Size: Small)  - Pulse 69 - Temp 36.2 ??C (97.1 ??F) (Temporal)  - Resp 18  - Ht 167.6 cm (5' 6)  - Wt 78.9 kg (173 lb 14.4 oz)  - SpO2 96%  - BMI 28.07 kg/m??   Constitutional: Healthy young man seen in exam chair in no acute distress  Eyes: PERRLA  Ears: Normal external auditory canals and tympanic membranes bilaterally  Nose: Normal anterior rhinoscopy with normal-appearing septum and turbinates  Oral Cavity: Carious right posterior maxillary molar.  The oral cavity including the lips, buccal mucosa, gingiva, floor of mouth, oral tongue and hard palate are benign.  Oropharynx: Benign appearing tongue base, lateral pharyngeal wall and soft palate  Indirect laryngoscopy: Normal-appearing tongue base, vallecula and larynx with normal vocal cord mobility bilaterally.  No evidence of pooling of secretions in the piriform sinuses.  Neck: There is a subcutaneous 1 cm movable mass associated with the overlying skin in the left upper neck just below behind the angle of the mandible.  I do not feel any other adenopathy or thyromegaly  Cranial Nerves: Intact  Skin: No suspicious lesions of the face or neck  Chest: Clear to auscultation  Cardiac: Regular rhythm without murmur  Abdomen: Soft and nontender without hepatosplenomegaly  Musculoskeletal: Normal extremities           This note was dictated with Dragon Medical and may contain typos missed during proofreading.

## 2020-03-22 NOTE — Unmapped (Signed)
Kevin [name]: [phone]    For nursing questions please call Renne Musca, CMA at [phone] or send a secure message through   Your [facility] for a faster response.     FAX: [phone]... Attention Deeya Richeson    For oncology related questions, call the Nurse Navigator [facility], at [phone]    For surgery scheduling please call Kerrin Mo at [phone]    For scheduling clinic appointments please call [phone]    For appointments or questions about radiology appointments please call [phone]    If you are concerned, do not hesitate to seek medical help at your local emergency department.    After hours, please call [phone] and ask for the ENT physician on call.    Urgent Medical questions: Call Triage at [phone]

## 2020-03-22 NOTE — Unmapped (Signed)
New Patient-Consult     Assessment:      29 y.o. year old male with a 1 year history of a left neck mass.  He first noticed this about 11 or 12 months ago and initially drained it with a needle on his own.  It recurred and has not really changed much since then.  He has had no dysphagia, odynophagia, hoarseness, hemoptysis, weight loss or other neck masses.    Physical exam today, including office ultrasound exam, this is a 1.2 cm cystic mass associated directly with the overlying skin most consistent with an epidermal inclusion cyst.  I have told him that this could be safely observed, however, he does not like its appearance or presence in his neck and desires to have it removed.    The risks, benefits, and alternatives of excision of the left neck lesion were discussed including the risk for chipped teeth, bleeding, infection, and damage to normal structures such as nerves and vessels. Specifically the risk of the final pathology showing something that would require further treatment was discussed. He has had a chance to ask questions and have those questions answered.  He understands and wishes to proceed with the surgery.         Plan:      Schedule for excision under local anesthesia with sedation on an outpatient basis     Subjective:       Reason for visit:    Chief Complaint   Patient presents with   ??? New Diagnosis   1 year history of left neck mass    History of Present Illness:  [name] is a 29 y.o. year old male seen in consultation at the request of Micheline Maze, Roque Lias,* for the evaluation of a 1 year history of a left neck mass.       I reviewed and summarized previous medical records from [facility] which illustrated recent diagnosis of HIV and syphilis.    Objective:   Objective   Past Medical History:  Past Medical History:   Diagnosis Date   ??? HIV disease (CMS-HCC)    ??? Syphilis        Past Surgical History:.  Past Surgical History:   Procedure Laterality Date   ??? DENTAL SURGERY Medications:    Current Outpatient Medications:   ???  bictegrav-emtricit-tenofov ala (BIKTARVY) 50-200-25 mg tablet, Take 1 tablet by mouth daily., Disp: 90 tablet, Rfl: 1  ???  imiquimod (ALDARA) 5 % cream, Apply 1 application topically 3 (three) times a week. (Patient not taking: Reported on 03/22/2020), Disp: 12 packet, Rfl: 2    Allergies:  No Known Allergies    Family History:  Family History   Problem Relation Age of Onset   ??? Crohn's disease Father    ??? Diabetes Maternal Grandmother    ??? Heart failure Maternal Grandmother    ??? Alzheimer's disease Maternal Grandfather    ??? Lymphoma Paternal Grandmother    ??? Alzheimer's disease Paternal Grandfather        Social History:  Social History     Tobacco Use   ??? Smoking status: Current Every Day Smoker     Packs/day: 0.25     Types: e-Cigarettes   ??? Smokeless tobacco: Never Used   ??? Tobacco comment: social, vaping   Substance Use Topics   ??? Alcohol use: Yes     Alcohol/week: 75.0 standard drinks     Types: 75 Shots of liquor per week   ??? Drug  use: Yes     Frequency: 7.0 times per week     Types: Cocaine, Marijuana     Comment: xanax and cocaine occasionally     Social History     Tobacco Use   Smoking Status Current Every Day Smoker   ??? Packs/day: 0.25   ??? Types: e-Cigarettes   Smokeless Tobacco Never Used   Tobacco Comment    social, vaping       Review of Systems:  The patient denies dysphagia, odynophagia, weight loss, dyspnea, hoarseness, or hemoptysis.    The patient denies other significant cardiorespiratory, gastrointestinal, oral, pharyngeal, laryngeal, genitourinary, musculoskeletal, soft tissue, neurological, ophthalmic, otologic, sinonasal, skin, psychiatric, or constitutional problems other than as mentioned above.    12 system review of systems was performed today and negative except for the pertinent positives and negatives noted in the HPI.      Physical Exam:   VITAL SIGNS: BP 130/79 (BP Site: R Arm, BP Position: Sitting, BP Cuff Size: Small)  - Pulse 69 - Temp 36.2 ??C (97.1 ??F) (Temporal)  - Resp 18  - Ht 167.6 cm (5' 6)  - Wt 78.9 kg (173 lb 14.4 oz)  - SpO2 96%  - BMI 28.07 kg/m??   Constitutional: Healthy young man seen in exam chair in no acute distress  Eyes: PERRLA  Ears: Normal external auditory canals and tympanic membranes bilaterally  Nose: Normal anterior rhinoscopy with normal-appearing septum and turbinates  Oral Cavity: Carious right posterior maxillary molar.  The oral cavity including the lips, buccal mucosa, gingiva, floor of mouth, oral tongue and hard palate are benign.  Oropharynx: Benign appearing tongue base, lateral pharyngeal wall and soft palate  Indirect laryngoscopy: Normal-appearing tongue base, vallecula and larynx with normal vocal cord mobility bilaterally.  No evidence of pooling of secretions in the piriform sinuses.  Neck: There is a subcutaneous 1 cm movable mass associated with the overlying skin in the left upper neck just below behind the angle of the mandible.  I do not feel any other adenopathy or thyromegaly  Cranial Nerves: Intact  Skin: No suspicious lesions of the face or neck  Chest: Clear to auscultation  Cardiac: Regular rhythm without murmur  Abdomen: Soft and nontender without hepatosplenomegaly  Musculoskeletal: Normal extremities           This note was dictated with Dragon Medical and may contain typos missed during proofreading.

## 2020-03-22 NOTE — Unmapped (Signed)
History and Physical     Assessment:      29 y.o. year old male with a 1 year history of a left neck mass.  He first noticed this about 11 or 12 months ago and initially drained it with a needle on his own.  It recurred and has not really changed much since then.  He has had no dysphagia, odynophagia, hoarseness, hemoptysis, weight loss or other neck masses.    Physical exam today, including office ultrasound exam, this is a 1.2 cm cystic mass associated directly with the overlying skin most consistent with an epidermal inclusion cyst.  I have told him that this could be safely observed, however, he does not like its appearance or presence in his neck and desires to have it removed.    The risks, benefits, and alternatives of excision of the left neck lesion were discussed including the risk for chipped teeth, bleeding, infection, and damage to normal structures such as nerves and vessels. Specifically the risk of the final pathology showing something that would require further treatment was discussed. He has had a chance to ask questions and have those questions answered.  He understands and wishes to proceed with the surgery.         Plan:      Schedule for excision under local anesthesia with sedation on an outpatient basis     Subjective:       Reason for visit:    Chief Complaint   Patient presents with   ??? New Diagnosis   1 year history of left neck mass    History of Present Illness:  [name] is a 29 y.o. year old male seen in consultation at the request of Micheline Maze, Roque Lias,* for the evaluation of a 1 year history of a left neck mass.       I reviewed and summarized previous medical records from [facility] which illustrated recent diagnosis of HIV and syphilis.    Objective:   Objective   Past Medical History:  Past Medical History:   Diagnosis Date   ??? HIV disease (CMS-HCC)    ??? Syphilis        Past Surgical History:.  Past Surgical History:   Procedure Laterality Date   ??? DENTAL SURGERY Medications:    Current Outpatient Medications:   ???  bictegrav-emtricit-tenofov ala (BIKTARVY) 50-200-25 mg tablet, Take 1 tablet by mouth daily., Disp: 90 tablet, Rfl: 1  ???  imiquimod (ALDARA) 5 % cream, Apply 1 application topically 3 (three) times a week. (Patient not taking: Reported on 03/22/2020), Disp: 12 packet, Rfl: 2    Allergies:  No Known Allergies    Family History:  Family History   Problem Relation Age of Onset   ??? Crohn's disease Father    ??? Diabetes Maternal Grandmother    ??? Heart failure Maternal Grandmother    ??? Alzheimer's disease Maternal Grandfather    ??? Lymphoma Paternal Grandmother    ??? Alzheimer's disease Paternal Grandfather        Social History:  Social History     Tobacco Use   ??? Smoking status: Current Every Day Smoker     Packs/day: 0.25     Types: e-Cigarettes   ??? Smokeless tobacco: Never Used   ??? Tobacco comment: social, vaping   Substance Use Topics   ??? Alcohol use: Yes     Alcohol/week: 75.0 standard drinks     Types: 75 Shots of liquor per week   ???  Drug use: Yes     Frequency: 7.0 times per week     Types: Cocaine, Marijuana     Comment: xanax and cocaine occasionally     Social History     Tobacco Use   Smoking Status Current Every Day Smoker   ??? Packs/day: 0.25   ??? Types: e-Cigarettes   Smokeless Tobacco Never Used   Tobacco Comment    social, vaping       Review of Systems:  The patient denies dysphagia, odynophagia, weight loss, dyspnea, hoarseness, or hemoptysis.    The patient denies other significant cardiorespiratory, gastrointestinal, oral, pharyngeal, laryngeal, genitourinary, musculoskeletal, soft tissue, neurological, ophthalmic, otologic, sinonasal, skin, psychiatric, or constitutional problems other than as mentioned above.    12 system review of systems was performed today and negative except for the pertinent positives and negatives noted in the HPI.      Physical Exam:   VITAL SIGNS: BP 130/79 (BP Site: R Arm, BP Position: Sitting, BP Cuff Size: Small)  - Pulse 69 - Temp 36.2 ??C (97.1 ??F) (Temporal)  - Resp 18  - Ht 167.6 cm (5' 6)  - Wt 78.9 kg (173 lb 14.4 oz)  - SpO2 96%  - BMI 28.07 kg/m??   Constitutional: Healthy young man seen in exam chair in no acute distress  Eyes: PERRLA  Ears: Normal external auditory canals and tympanic membranes bilaterally  Nose: Normal anterior rhinoscopy with normal-appearing septum and turbinates  Oral Cavity: Carious right posterior maxillary molar.  The oral cavity including the lips, buccal mucosa, gingiva, floor of mouth, oral tongue and hard palate are benign.  Oropharynx: Benign appearing tongue base, lateral pharyngeal wall and soft palate  Indirect laryngoscopy: Normal-appearing tongue base, vallecula and larynx with normal vocal cord mobility bilaterally.  No evidence of pooling of secretions in the piriform sinuses.  Neck: There is a subcutaneous 1 cm movable mass associated with the overlying skin in the left upper neck just below behind the angle of the mandible.  I do not feel any other adenopathy or thyromegaly  Cranial Nerves: Intact  Skin: No suspicious lesions of the face or neck  Chest: Clear to auscultation  Cardiac: Regular rhythm without murmur  Abdomen: Soft and nontender without hepatosplenomegaly  Musculoskeletal: Normal extremities           This note was dictated with Dragon Medical and may contain typos missed during proofreading.

## 2020-03-23 DIAGNOSIS — D367 Benign neoplasm of other specified sites: Principal | ICD-10-CM

## 2020-03-24 NOTE — Unmapped (Signed)
[facility] Refill Coordination Note    Specialty Medication(s) to be Shipped:   Infectious Disease: Biktarvy    Other medication(s) to be shipped:       Kevin [name], DOB: 10-Aug-1991  Phone: [phone] (home)       All above HIPAA information was verified with patient.     Was a Nurse, learning disability used for this call? No    Completed refill call assessment today to schedule patient's medication shipment from the [facility] [phone]).       Specialty medication(s) and dose(s) confirmed: Regimen is correct and unchanged.   Changes to medications: Kevin [name] reports no changes at this time.  Changes to insurance: No  Questions for the pharmacist: No    Confirmed patient received Welcome Packet with first shipment. The patient will receive a drug information handout for each medication shipped and additional FDA Medication Guides as required.       DISEASE/MEDICATION-SPECIFIC INFORMATION        N/A    SPECIALTY MEDICATION ADHERENCE     Medication Adherence    Patient reported X missed doses in the last month: 0  Specialty Medication: biktarvy 50-200-25mg   Patient is on additional specialty medications: No  Informant: patient  Reliability of informant: reliable  Patient is at risk for Non-Adherence: No                Bikyarvy 50-200-25 mg: 8 days of medicine on hand         SHIPPING     Shipping address confirmed in Epic.     Delivery Scheduled: Yes, Expected medication delivery date: 05/07.     Medication will be delivered via Next Day Courier to the prescription address in Epic WAM.    Kevin [name]   [facility] Specialty Technician

## 2020-03-25 NOTE — Unmapped (Signed)
Kevin [name] 's BIKTARVY shipment will be sent out  as a result of the medication is too soon to refill until 5/10.     I have reached out to the patient and communicated the delivery change. We will reschedule the medication for the delivery date that the patient agreed upon.  We have confirmed the delivery date as 5/10, via same day courier.

## 2020-03-29 MED FILL — BIKTARVY 50 MG-200 MG-25 MG TABLET: 30 days supply | Qty: 30 | Fill #2 | Status: AC

## 2020-03-29 MED FILL — BIKTARVY 50 MG-200 MG-25 MG TABLET: ORAL | 30 days supply | Qty: 30 | Fill #2

## 2020-04-02 ENCOUNTER — Encounter: Admit: 2020-04-02 | Discharge: 2020-04-03 | Payer: PRIVATE HEALTH INSURANCE | Attending: Family | Primary: Family

## 2020-04-03 NOTE — Unmapped (Signed)
COVID Pre-Procedure Intake Form     Assessment     Kevin [name] is a 29 y.o. male presenting to [facility] for COVID testing.     Plan     If no testing performed, pt counseled on routine care for respiratory illness.  If testing performed, COVID sent.  Patient directed to Home given findings during today's visit.    Subjective     Kevin [name] is a 29 y.o. male who [facility] with complaints of the following:    Exposure History: In the last 21 days?     Have you traveled outside of [place]? No               Have you been in close contact with someone confirmed by a test to have COVID? (Close contact is within 6 feet for at least 10 minutes) No       Have you worked in a health care facility? No     Lived or worked facility like a nursing home, group home, or assisted living?    No         Are you scheduled to have surgery or a procedure in the next 3 days? Yes               Are you scheduled to receive cancer chemotherapy within the next 7 days?    No     Have you ever been tested before for COVID-19 with a swab of your nose? Yes: When: 02/2020, Where: [facility]   Are you a healthcare worker being tested so to return to work No     Right now,  do you have any of the following that developed over the past 7 days (as stated by patient on intake form):    Subjective fever (felt feverish) No   Chills (especially repeated shaking chills) No   Severe fatigue (felt very tired) No   Muscle aches No   Runny nose No   Sore throat No   Loss of taste or smell No   Cough (new onset or worsening of chronic cough) No   Shortness of breath No   Nausea or vomiting No   Headache No   Abdominal Pain No   Diarrhea (3 or more loose stools in last 24 hours) No       Scribe's Attestation: Rekia Kujala L. Lorin Picket, FNP obtained and performed the history, physical exam and medical decision making elements that were entered into the chart.  Signed by Alleen Borne serving as Scribe, on 04/03/2020 8:46 AM        The documentation recorded by the scribe accurately reflects the service I personally performed and the decisions made by me. Aida Puffer, FNP  Apr 05, 2020 1:40 PM

## 2020-04-06 ENCOUNTER — Encounter
Admit: 2020-04-06 | Discharge: 2020-04-06 | Payer: PRIVATE HEALTH INSURANCE | Attending: Student in an Organized Health Care Education/Training Program | Primary: Student in an Organized Health Care Education/Training Program

## 2020-04-06 ENCOUNTER — Ambulatory Visit: Admit: 2020-04-06 | Discharge: 2020-04-06 | Payer: PRIVATE HEALTH INSURANCE

## 2020-04-06 NOTE — Unmapped (Signed)
Procedure Note    A surgical timeout was performed and informed consent obtained.    Pre-operative Diagnosis: Epidermal inclusion cyst left neck    Post-operative Diagnosis: Same    Procedure(s) Performed:  Left - EXCISION TUMOR, SOFT TISSUE OF NECK OR THORAX; DEEP, SUBFASCIAL (EG, INTRAMUSCULAR); LESS THAN 5 CM    Indications: The patient is a 29 year old gentleman with HIV infection and a 1 year history of a left neck mass.  Clinically this appears to be an epidermal inclusion cyst but he has significant anxiety about it.    Surgeon: Dr. Loraine Leriche C. Kristine Garbe, MD, FACS    Assistant: Dr. Leodis Sias    Anesthesia: local with sedation    Findings: cyst removed intact with overlying skin and punctum    Procedure Details:    The patient was brought to the OR, positively identified and a surgical timeout performed.  The patient was sedated intravenously and 2 cc of 1% lidocaine with epinephrine was injected around and beneath the cyst.  An additional 8 cc of the same solution was injected at Erb's point to perform a cervical block.  The skin of the neck was then sterilely prepped and draped in usual manner.    An elliptical incision was fashioned in the natural skin creases.  The incision was carried down through the skin and subcutaneous tissues beveling away from the cyst.  Using a combination of sharp and blunt dissection the entire cyst was removed.  The greater auricular nerve was identified deep to the cyst and preserved.  The entire cyst along with an ellipse of overlying skin and the punctum were then removed and sent to pathology.  Was copiously irrigated with sterile saline and meticulous hemostasis was obtained with the bipolar cautery.    The wound was closed in layers.  Interrupted sutures of 3-0 Vicryl were used in the subcutaneous tissues and the skin was closed with a running subcuticular suture of 5-0 Monocryl.  Mastisol and Steri-Strips were applied to the skin surface.    Estimated Blood Loss: 1 mL IV Fluids:  ?? Crystalloid: 700 mL  ?? Colloid: 0 mL  ?? Blood: 0 units    Urine: 0 mL    Specimens:    ID Type Source Tests Collected by Time Destination   1 : Left neck mass probable EIC  Tissue Neck SURGICAL PATHOLOGY EXAM Derry Lory, MD 04/06/2020 0754        Condition: Stable.  Patient tolerated procedure well.    Complications: None    I was personally present and participated directly in this entire procedure.    Carisa Backhaus C. Kristine Garbe, MD, FACS    This note was dictated with Dragon Medical and may contain typos missed during proofreading.

## 2020-04-06 NOTE — Unmapped (Signed)
Brief Operative Note  (CSN: [id number])      Date of Surgery: 04/06/2020    Pre-op Diagnosis: left neck epidermal inclusion cyst    Post-op Diagnosis: epidermal inclusion cyst left neck    Procedure(s):  EXCISION TUMOR, SOFT TISSUE OF NECK OR THORAX; DEEP, SUBFASCIAL (EG, INTRAMUSCULAR); LESS THAN 5 CM:   Note: Revisions to procedures should be made in chart - see Procedures activity.    Performing Service: ENT  Surgeon(s) and Role:     * Derry Lory, MD - Primary    Assistant: None    Findings: cyst removed intact with punctum    Anesthesia: Conscious Sedation (Nurse Admin)    Estimated Blood Loss: 1 mL    Complications: None    Specimens:   ID Type Source Tests Collected by Time Destination   1 : Left neck mass probable EIC  Tissue Neck SURGICAL PATHOLOGY EXAM Derry Lory, MD 04/06/2020 0754        Implants: * No implants in log *    Surgeon Notes: I was present and scrubbed for the entire procedure    Bryson Corona   Date: 04/06/2020  Time: 7:58 AM

## 2020-04-07 NOTE — Unmapped (Signed)
Patient has surgery yesterday,  Patient requesting work excuse, was scheduled to work today.    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    Would like to know when he can return to work.

## 2020-04-07 NOTE — Unmapped (Signed)
Hi,     Charles Andringa contacted the [employer] requesting to speak with the care team of Kevin [name] to discuss:    Patient works in [employer], concerned about returning to work immediately due to lifting restrictions. Needs to know when it's safe to return.    Please contact [name] at [phone].    Thank you,   Kelli Hope  [facility]   [phone]

## 2020-04-08 NOTE — Unmapped (Signed)
Confirmed from provider pt is cleared to return to work today without restrictions.    RTW letter was placed yesterday.

## 2020-04-20 ENCOUNTER — Non-Acute Institutional Stay: Admit: 2020-04-20 | Payer: PRIVATE HEALTH INSURANCE

## 2020-04-21 NOTE — Unmapped (Signed)
[facility] Refill Coordination Note    Specialty Medication(s) to be Shipped:   Infectious Disease: Biktarvy    Other medication(s) to be shipped: n/a     Kevin [name], DOB: [birth year]  Phone: [phone] (home)       All above HIPAA information was verified with patient's family member, father Kevin [name].     Was a Nurse, learning disability used for this call? No    Completed refill call assessment today to schedule patient's medication shipment from the [facility] [phone]).       Specialty medication(s) and dose(s) confirmed: Regimen is correct and unchanged.   Changes to medications: Kevin [name] reports no changes at this time.  Changes to insurance: No  Questions for the pharmacist: No    Confirmed patient received Welcome Packet with first shipment. The patient will receive a drug information handout for each medication shipped and additional FDA Medication Guides as required.       DISEASE/MEDICATION-SPECIFIC INFORMATION        N/A    SPECIALTY MEDICATION ADHERENCE     Medication Adherence    Patient reported X missed doses in the last month: 0  Specialty Medication: biktarvy            Unable to confirm quantity on hand.      SHIPPING     Shipping address confirmed in Epic.     Delivery Scheduled: Yes, Expected medication delivery date: 6/11.     Medication will be delivered via Next Day Courier to the prescription address in Epic WAM.    Westley Gambles   [facility] Specialty Technician

## 2020-04-29 MED FILL — BIKTARVY 50 MG-200 MG-25 MG TABLET: ORAL | 30 days supply | Qty: 30 | Fill #3

## 2020-04-29 MED FILL — BIKTARVY 50 MG-200 MG-25 MG TABLET: 30 days supply | Qty: 30 | Fill #3 | Status: AC

## 2020-05-26 NOTE — Unmapped (Signed)
[facility] Refill Coordination Note    Specialty Medication(s) to be Shipped:   Infectious Disease: Biktarvy    Other medication(s) to be shipped:       Kevin [name], DOB: 06/27/1991  Phone: [phone] (home)       All above HIPAA information was verified with patient.     Was a Nurse, learning disability used for this call? No    Completed refill call assessment today to schedule patient's medication shipment from the [facility] [phone]).       Specialty medication(s) and dose(s) confirmed: Regimen is correct and unchanged.   Changes to medications: Tyrick reports no changes at this time.  Changes to insurance: No  Questions for the pharmacist: No    Confirmed patient received Welcome Packet with first shipment. The patient will receive a drug information handout for each medication shipped and additional FDA Medication Guides as required.       DISEASE/MEDICATION-SPECIFIC INFORMATION        N/A    SPECIALTY MEDICATION ADHERENCE     Medication Adherence    Patient reported X missed doses in the last month: 0  Specialty Medication: biktarvy 50-200-25mg   Patient is on additional specialty medications: No  Informant: patient  Reliability of informant: reliable  Patient is at risk for Non-Adherence: No                biktarvy  50-2000-25 mg: 4 days of medicine on hand         SHIPPING     Shipping address confirmed in Epic.     Delivery Scheduled: Yes, Expected medication delivery date: 07/09.     Medication will be delivered via Same Day Courier to the prescription address in Epic WAM.    [name]   [facility] Specialty Technician

## 2020-05-28 MED FILL — BIKTARVY 50 MG-200 MG-25 MG TABLET: 30 days supply | Qty: 30 | Fill #4 | Status: AC

## 2020-05-28 MED FILL — BIKTARVY 50 MG-200 MG-25 MG TABLET: ORAL | 30 days supply | Qty: 30 | Fill #4

## 2020-06-14 ENCOUNTER — Encounter: Admit: 2020-06-14 | Payer: PRIVATE HEALTH INSURANCE

## 2020-06-22 NOTE — Unmapped (Signed)
[facility] Pharm[facility] Refill Coordination Note    Specialty Medication(s) to be Shipped:   Infectious Disease: Biktarvy    Other medication(s) to be shipped: No additional medications requested for fill at this time     Kevin [name], DOB: 11-22-1990  Phone: [phone] (home)       All above HIPAA information was verified with patient's family member, dad.     Was a Nurse, learning disability used for this call? No    Completed refill call assessment today to schedule patient's medication shipment from the [facility] [phone]).       Specialty medication(s) and dose(s) confirmed: Regimen is correct and unchanged.   Changes to medications: [name] reports no changes at this time.  Changes to insurance: No  Questions for the pharmacist: No    Confirmed patient received Welcome Packet with first shipment. The patient will receive a drug information handout for each medication shipped and additional FDA Medication Guides as required.       DISEASE/MEDICATION-SPECIFIC INFORMATION        N/A    SPECIALTY MEDICATION ADHERENCE     Medication Adherence    Patient reported X missed doses in the last month: 0  Specialty Medication: biktarvy  Patient is on additional specialty medications: No  Patient is on more than two specialty medications: No  Any gaps in refill history greater than 2 weeks in the last 3 months: no  Demonstrates understanding of importance of adherence: yes  Informant: father                Biktarvy 50-200-25mg : Patient has 7 days of medication on hand      SHIPPING     Shipping address confirmed in Epic.     Delivery Scheduled: Yes, Expected medication delivery date: 8/9.     Medication will be delivered via Same Day Courier to the prescription address in Epic WAM.    [name]   [facility] Specialty Technician

## 2020-06-28 MED FILL — BIKTARVY 50 MG-200 MG-25 MG TABLET: 30 days supply | Qty: 30 | Fill #5 | Status: AC

## 2020-06-28 MED FILL — BIKTARVY 50 MG-200 MG-25 MG TABLET: ORAL | 30 days supply | Qty: 30 | Fill #5

## 2020-07-19 DIAGNOSIS — B20 Human immunodeficiency virus [HIV] disease: Principal | ICD-10-CM

## 2020-07-19 MED ORDER — BIKTARVY 50 MG-200 MG-25 MG TABLET
ORAL_TABLET | Freq: Every day | ORAL | 1 refills | 90.00000 days | Status: CN
Start: 2020-07-19 — End: ?

## 2020-07-19 NOTE — Unmapped (Signed)
[facility] Clinical Assessment & Refill Coordination Note    Kevin [name], DOB: [birth year]  Phone: [phone] (home)     All above HIPAA information was verified with patient's family member, Kevin [name] (father).     Was a Nurse, learning disability used for this call? No    Specialty Medication(s):   Infectious Disease: Biktarvy     Current Outpatient Medications   Medication Sig Dispense Refill   ??? bictegrav-emtricit-tenofov ala (BIKTARVY) 50-200-25 mg tablet Take 1 tablet by mouth daily. 90 tablet 1     No current facility-administered medications for this visit.        Changes to medications: no changes    No Known Allergies    Changes to allergies: No    SPECIALTY MEDICATION ADHERENCE     Biktarvy   : approximately 7 days of medicine on hand       Medication Adherence    Patient reported X missed doses in the last month: 0  Specialty Medication: Biktarvy  Patient is on additional specialty medications: No  Any gaps in refill history greater than 2 weeks in the last 3 months: no  Demonstrates understanding of importance of adherence: yes  Informant: father  Reliability of informant: reliable  Provider-estimated medication adherence level: good  Patient is at risk for Non-Adherence: No          Specialty medication(s) dose(s) confirmed: Regimen is correct and unchanged.     Are there any concerns with adherence? No    Adherence counseling provided? Not needed    CLINICAL MANAGEMENT AND INTERVENTION      Clinical Benefit Assessment:    Do you feel the medicine is effective or helping your condition? Yes    HIV ASSOCIATED LABS:     Lab Results   Component Value Date/Time    HIVRS Not Detected 02/26/2020 10:39 AM    HIVCP 86 (H) 12/23/2019 02:11 PM    HIVCP 34,583 (H) 03/12/2019 04:46 PM    ACD4 546 12/23/2019 02:11 PM    ACD4 241 (L) 03/12/2019 04:46 PM       Clinical Benefit counseling provided? Labs from 02/26/20 show evidence of clinical benefit    Adverse Effects Assessment:    Are you experiencing any side effects? No    Are you experiencing difficulty administering your medicine? No    Quality of Life Assessment:    How many days over the past month did your HIV  keep you from your normal activities? For example, brushing your teeth or getting up in the morning. 0    Have you discussed this with your provider? Not needed    Therapy Appropriateness:    Is therapy appropriate? Yes, therapy is appropriate and should be continued    DISEASE/MEDICATION-SPECIFIC INFORMATION      N/A    PATIENT SPECIFIC NEEDS     - Does the patient have any physical, cognitive, or cultural barriers? No    - Is the patient high risk? No    - Does the patient require a Care Management Plan? No     - Does the patient require physician intervention or other additional services (i.e. nutrition, smoking cessation, social work)? No      SHIPPING     Specialty Medication(s) to be Shipped:   Infectious Disease: Biktarvy    Other medication(s) to be shipped: No additional medications requested for fill at this time     Changes to insurance: No    Delivery  Scheduled: Yes, Expected medication delivery date: 07/23/20.  However, Rx request for refills was sent to the provider as there are none remaining.     Medication will be delivered via Next Day Courier to the confirmed prescription address in [facility].    The patient will receive a drug information handout for each medication shipped and additional FDA Medication Guides as required.  Verified that patient has previously received a Conservation officer, historic buildings.    All of the patient's questions and concerns have been addressed.    Kevin [name]   [name] Specialty Pharmacist

## 2020-07-20 MED ORDER — BIKTARVY 50 MG-200 MG-25 MG TABLET
ORAL_TABLET | Freq: Every day | ORAL | 1 refills | 90.00000 days | Status: CP
Start: 2020-07-20 — End: ?
  Filled 2020-07-27: qty 30, 30d supply, fill #0

## 2020-07-20 NOTE — Unmapped (Signed)
Update:     Mr. Milanes medication will now be delivered to the prescription address listed in Epic [place] via our Same Day Courier on 07/27/20. I have confirmed delivery with his father, Sultan Pargas.

## 2020-07-20 NOTE — Unmapped (Signed)
Kevin [name] 's Biktarvy shipment will be delayed as a result of the medication is too soon to refill until 07/27/2020.     I have reached out to the patient and left a voicemail message.  We will wait for a call back from the patient to reschedule the delivery.  We have not confirmed the new delivery date.

## 2020-07-27 MED FILL — BIKTARVY 50 MG-200 MG-25 MG TABLET: 30 days supply | Qty: 30 | Fill #0 | Status: AC

## 2020-08-26 NOTE — Unmapped (Signed)
The [facility] has made a second and final attempt to reach this patient to refill the following medication: biktarvy.      We have left voicemails on the following phone numbers: [phone]  and [phone].    Dates contacted: 9/30, 10/7  Last scheduled delivery: shipped 9/7    The patient may be at risk of non-compliance with this medication. The patient should call the [facility] at [phone] (option 4) to refill medication.    Westley Gambles   [facility] Specialty Technician

## 2020-08-30 NOTE — Unmapped (Signed)
[facility] Refill Coordination Note    Specialty Medication(s) to be Shipped:   Infectious Disease: Biktarvy    Other medication(s) to be shipped: No additional medications requested for fill at this time     Kevin [name], DOB: Dec 23, 1990  Phone: [phone] (home)       All above HIPAA information was verified with patient's caregiver, Kevin [name]     Was a translator used for this call? No    Completed refill call assessment today to schedule patient's medication shipment from the [facility] [phone]).       Specialty medication(s) and dose(s) confirmed: Regimen is correct and unchanged.   Changes to medications: Ebrima reports no changes at this time.  Changes to insurance: No  Questions for the pharmacist: No    Confirmed patient received Welcome Packet with first shipment. The patient will receive a drug information handout for each medication shipped and additional FDA Medication Guides as required.       DISEASE/MEDICATION-SPECIFIC INFORMATION        N/A    SPECIALTY MEDICATION ADHERENCE     Medication Adherence    Patient reported X missed doses in the last month: 0  Specialty Medication: biktarvy 50-200-25mg   Patient is on additional specialty medications: No        Biktarvy 50-200-25 mg: 7 days of medicine on hand     SHIPPING     Shipping address confirmed in Epic.     Delivery Scheduled: Yes, Expected medication delivery date: 09/03/2020.     Medication will be delivered via Next Day Courier to the prescription address in Epic WAM.    Oretha Milch   [facility] Specialty Technician

## 2020-09-01 NOTE — Unmapped (Signed)
Kevin [name],     I called this patient and he stated he is in school and work and wanted to know can he do a video visit with you next week. His lunch break is at 12 noon daily and then he can do in person on Friday morning he stated or video Friday morning? Not sure if you are able to see him at any of these times. ?     Also I let patient know to [facility]     Thanks Porchia

## 2020-09-01 NOTE — Unmapped (Signed)
[name],     Ok no problem     I called him and left a message and also added him to recall. So ill be reminded to call him back      Thanks Porchia

## 2020-09-01 NOTE — Unmapped (Signed)
Eric.      He stated on fridays he can come early he has to be at work at 11 on Friday so he needs a early  appointment.  Is there a time I can give him?  he said he is available next Friday morning ?

## 2020-09-02 MED FILL — BIKTARVY 50 MG-200 MG-25 MG TABLET: ORAL | 30 days supply | Qty: 30 | Fill #1

## 2020-09-02 MED FILL — BIKTARVY 50 MG-200 MG-25 MG TABLET: 30 days supply | Qty: 30 | Fill #1 | Status: AC

## 2020-10-01 NOTE — Unmapped (Signed)
[facility] Refill Coordination Note    Specialty Medication(s) to be Shipped:   Infectious Disease: Biktarvy    Other medication(s) to be shipped: No additional medications requested for fill at this time     ALGENIS BALLIN, DOB: [birth year]  Phone: [phone] (home)       All above HIPAA information was verified with patient's family member, dad lee.     Was a Nurse, learning disability used for this call? No    Completed refill call assessment today to schedule patient's medication shipment from the [facility] [phone]).       Specialty medication(s) and dose(s) confirmed: Regimen is correct and unchanged.   Changes to medications: Abdishakur reports no changes at this time.  Changes to insurance: No  Questions for the pharmacist: No    Confirmed patient received Welcome Packet with first shipment. The patient will receive a drug information handout for each medication shipped and additional FDA Medication Guides as required.       DISEASE/MEDICATION-SPECIFIC INFORMATION        N/A    SPECIALTY MEDICATION ADHERENCE     Medication Adherence    Patient reported X missed doses in the last month: 0  Specialty Medication: biktarvy 50-200-25mg   Patient is on additional specialty medications: No  Patient is on more than two specialty medications: No                biktarvy  : 7 days of medicine on hand         SHIPPING     Shipping address confirmed in Epic.     Delivery Scheduled: Yes, Expected medication delivery date: 11/16.     Medication will be delivered via Next Day Courier to the prescription address in Epic WAM.    Westley Gambles   [facility] Specialty Technician

## 2020-10-04 MED FILL — BIKTARVY 50 MG-200 MG-25 MG TABLET: 30 days supply | Qty: 30 | Fill #2 | Status: AC

## 2020-10-04 MED FILL — BIKTARVY 50 MG-200 MG-25 MG TABLET: ORAL | 30 days supply | Qty: 30 | Fill #2

## 2020-11-01 NOTE — Unmapped (Signed)
[facility] Refill Coordination Note    Specialty Medication(s) to be Shipped:   Infectious Disease: Biktarvy    Other medication(s) to be shipped: No additional medications requested for fill at this time     Kevin [name], DOB: 1991/04/12  Phone: [phone] (home)       All above HIPAA information was verified with patient's family member, Kevin [name].     Was a Nurse, learning disability used for this call? No    Completed refill call assessment today to schedule patient's medication shipment from the [facility] [phone]).       Specialty medication(s) and dose(s) confirmed: Regimen is correct and unchanged.   Changes to medications: Kevin [name] reports no changes at this time.  Changes to insurance: No  Questions for the pharmacist: No    Confirmed patient received Welcome Packet with first shipment. The patient will receive a drug information handout for each medication shipped and additional FDA Medication Guides as required.       DISEASE/MEDICATION-SPECIFIC INFORMATION        N/A    SPECIALTY MEDICATION ADHERENCE     Medication Adherence    Patient reported X missed doses in the last month: 0  Specialty Medication: biktarvy 50-200-25mg                 biktarvy  : 4 days of medicine on hand         SHIPPING     Shipping address confirmed in Epic.     Delivery Scheduled: Yes, Expected medication delivery date: 12/16.     Medication will be delivered via Next Day Courier to the prescription address in Epic WAM.    Westley Gambles   [facility] Specialty Technician

## 2020-11-03 MED FILL — BIKTARVY 50 MG-200 MG-25 MG TABLET: 30 days supply | Qty: 30 | Fill #3 | Status: AC

## 2020-11-04 ENCOUNTER — Encounter: Admit: 2020-11-04 | Discharge: 2020-11-04 | Payer: PRIVATE HEALTH INSURANCE

## 2020-11-04 DIAGNOSIS — B20 Human immunodeficiency virus [HIV] disease: Principal | ICD-10-CM

## 2020-11-04 DIAGNOSIS — Z113 Encounter for screening for infections with a predominantly sexual mode of transmission: Principal | ICD-10-CM

## 2020-11-04 DIAGNOSIS — L989 Disorder of the skin and subcutaneous tissue, unspecified: Principal | ICD-10-CM

## 2020-11-04 LAB — LYMPH MARKER LIMITED,FLOW
ABSOLUTE CD3 CNT: 1232 {cells}/uL (ref 915–3400)
ABSOLUTE CD4 CNT: 528 {cells}/uL (ref 510–2320)
ABSOLUTE CD8 CNT: 640 {cells}/uL (ref 180–1520)
CD3% (T CELLS)": 77 % (ref 61–86)
CD4% (T HELPER)": 33 % — ABNORMAL LOW (ref 34–58)
CD4:CD8 RATIO: 0.8 — ABNORMAL LOW (ref 0.9–4.8)
CD8% T SUPPRESR": 40 % — ABNORMAL HIGH (ref 12–38)

## 2020-11-04 LAB — CBC W/ AUTO DIFF
BASOPHILS ABSOLUTE COUNT: 0 10*9/L (ref 0.0–0.1)
BASOPHILS RELATIVE PERCENT: 0.5 %
EOSINOPHILS ABSOLUTE COUNT: 0.1 10*9/L (ref 0.0–0.7)
EOSINOPHILS RELATIVE PERCENT: 1.1 %
HEMATOCRIT: 44.5 % (ref 38.0–50.0)
HEMOGLOBIN: 15.3 g/dL (ref 13.5–17.5)
LYMPHOCYTES ABSOLUTE COUNT: 1.6 10*9/L (ref 0.7–4.0)
LYMPHOCYTES RELATIVE PERCENT: 22.4 %
MEAN CORPUSCULAR HEMOGLOBIN CONC: 34.3 g/dL (ref 30.0–36.0)
MEAN CORPUSCULAR HEMOGLOBIN: 31.5 pg (ref 26.0–34.0)
MEAN CORPUSCULAR VOLUME: 91.9 fL (ref 81.0–95.0)
MEAN PLATELET VOLUME: 8.7 fL (ref 7.0–10.0)
MONOCYTES ABSOLUTE COUNT: 0.7 10*9/L (ref 0.1–1.0)
MONOCYTES RELATIVE PERCENT: 10.2 %
NEUTROPHILS ABSOLUTE COUNT: 4.6 10*9/L (ref 1.7–7.7)
NEUTROPHILS RELATIVE PERCENT: 65.8 %
PLATELET COUNT: 232 10*9/L (ref 150–450)
RED BLOOD CELL COUNT: 4.84 10*12/L (ref 4.32–5.72)
RED CELL DISTRIBUTION WIDTH: 13.8 % (ref 12.0–15.0)
WBC ADJUSTED: 7 10*9/L (ref 3.5–10.5)

## 2020-11-04 LAB — BASIC METABOLIC PANEL
ANION GAP: 7 mmol/L (ref 5–14)
BLOOD UREA NITROGEN: 16 mg/dL (ref 9–23)
BUN / CREAT RATIO: 20
CALCIUM: 9.5 mg/dL (ref 8.7–10.4)
CHLORIDE: 107 mmol/L (ref 98–107)
CO2: 24.5 mmol/L (ref 20.0–31.0)
CREATININE: 0.8 mg/dL
EGFR CKD-EPI AA MALE: >90 mL/min/{1.73_m2} (ref >=60)
EGFR CKD-EPI NON-AA MALE: >90 mL/min/{1.73_m2} (ref >=60)
GLUCOSE RANDOM: 101 mg/dL (ref 70–179)
POTASSIUM: 3.8 mmol/L (ref 3.4–4.5)
SODIUM: 138 mmol/L (ref 135–145)

## 2020-11-04 LAB — SYPHILIS SCREEN: SYPHILIS RPR SCREEN: NONREACTIVE

## 2020-11-04 LAB — AST: AST (SGOT): 31 U/L (ref <=34)

## 2020-11-04 LAB — ALT: ALT (SGPT): 33 U/L (ref 10–49)

## 2020-11-04 LAB — BILIRUBIN, TOTAL: BILIRUBIN TOTAL: 0.5 mg/dL (ref 0.3–1.2)

## 2020-11-04 MED ORDER — BIKTARVY 50 MG-200 MG-25 MG TABLET
ORAL_TABLET | Freq: Every day | ORAL | 1 refills | 90 days | Status: CP
Start: 2020-11-04 — End: 2020-11-30
  Filled 2020-11-03: qty 30, 30d supply, fill #3

## 2020-11-04 MED ORDER — DOXYCYCLINE HYCLATE 100 MG TABLET
ORAL_TABLET | Freq: Two times a day (BID) | ORAL | 0 refills | 7 days | Status: CP
Start: 2020-11-04 — End: 2020-11-11

## 2020-11-04 NOTE — Unmapped (Addendum)
INFECTIOUS DISEASES CLINIC  [address]  [place], [place]  [address]  P [phone]  F [phone]     Primary care provider: Trigg Delarocha Nonda Lou, [place]    Assessment/Plan:      HIV (dx'd 02/2019, nadir CD4 241 / 20% in 02/2019)  - chronic, stable  Overall Kevin [name] is doing well, with . Current regimen: Biktarvy (BIC/FTC/TAF)  Misses doses of ARVs rarely    Med access through insurance  CD4 count 546/22%. Will check today as it has not been >300 for >2 years.  Discussed ARV adherence, potential ARV-associated weight gain and U=U (tx as prevention)    Lab Results   Component Value Date    ACD4 546 12/23/2019    CD4 22 (L) 12/23/2019    HIVRS Not Detected 02/26/2020    HIVCP 86 (H) 12/23/2019       ??? CD4, HIV RNA, and safety labs (full return panel)  ??? Continue current therapy  ??? Encouraged continued excellent ARV adherence    Nodule of chin  - 7 day history of erythematous, slightly tender nodule under his chin. He reports similar lesions in the past he attributes to ingrown hairs. He is using warm compresses daily.  - On exam, 1.5cm erythematous indurated nodule without any fluctuance. No spontaneous drainage.   - Advised to continue warm compresses 3-4x/day.  - Start Doxycyline 100mg  BID x 7 days  - ER guidance given should he develop fever, chills, worsening of nodule as he may need I&D.  -     doxycycline (VIBRA-TABS) 100 MG tablet; Take 1 tablet (100 mg total) by mouth Two (2) times a day for 7 days.    Sexual health & secondary prevention   Sexually active with men. He had 1 partner since his last visit, which is a regular partner but unknown if partner is monogamous. He requests STI screening today.    Lab Results   Component Value Date    RPR Nonreactive 02/26/2020    RPR Nonreactive 12/23/2019    CTNAA Negative 02/26/2020    CTNAA Negative 02/26/2020    CTNAA Negative 02/26/2020    GCNAA Negative 02/26/2020    GCNAA Negative 02/26/2020    GCNAA Negative 02/26/2020    SPECSOURCE Urine 02/26/2020 SPECSOURCE Throat 02/26/2020    SPECSOURCE Rectum 02/26/2020       ??? GC/CT NAATs - obtained today from all exposed anatomical site(s)  ??? RPR - for screening obtained today      Health maintenance     Metabolic conditions  Wt Readings from Last 5 Encounters:   11/04/20 82.2 kg (181 lb 3.2 oz)   03/22/20 78.9 kg (173 lb 14.4 oz)   02/26/20 78 kg (172 lb)   12/23/19 79.6 kg (175 lb 8 oz)   03/12/19 73.2 kg (161 lb 6.4 oz)     Lab Results   Component Value Date    CREATININE 0.94 12/23/2019    GLUCOSEU Negative 03/12/2019    GLU 92 12/23/2019    A1C 5.0 03/12/2019    ALT 39 12/23/2019    ALT 61 (H) 03/12/2019     # Kidney health - creatinine today  # Bone health - assessment not yet needed (under age 26)  # Diabetes assessment - random glucose today  # NAFLD assessment - suspicion for NAFLD low    Communicable diseases  Lab Results   Component Value Date    QFTTBGOLD Negative 03/12/2019    HEPAIGG Nonreactive 03/12/2019  HEPBSAB Nonreactive 03/12/2019    HEPCAB Reactive (A) 03/12/2019    HCVRNA Not Detected 12/23/2019     # TB screening - no longer needed; negative IGRA, low risk  # Hepatitis screening - Hep A and B non-immune. Recommend immunizations- he declines at this time.  # MMR screening - not assessed    Cancer screening  Lab Results   Component Value Date    FINALDX  04/06/2020     A: Soft tissue, left neck, excision  - Epidermal cyst, pilar type    This electronic signature is attestation that the pathologist personally reviewed the submitted material(s) and the final diagnosis reflects that evaluation.       Cardiovascular disease  Lab Results   Component Value Date    CHOL 169 03/12/2019    HDL 26 (L) 03/12/2019    LDL 123 (H) 03/12/2019    NONHDL 143 03/12/2019    TRIG 100 03/12/2019     We discussed recommended immunizations, including Prevnar and HPV vaccine (if not already given, especially in light of recent anal condyloma). He also needs influenza vaccine and Hep A and B vaccinations. He declines all vaccines today but agrees to a nurse visit to get his Prevnar and influenza vaccinations.     He got the Covid-19 vaccine in June or July but doesn't have his card today.     I personally spent 35 minutes face-to-face and non-face-to-face in the care of this patient, which includes all pre, intra, and post visit time on the date of service.      Disposition  Next appointment: 5-6 months    Lahoma Rocker, PA-C  [facility]   [address]  [place], [place].  [address]  Phone: [phone]   Fax: [phone]      Subjective      Chief Complaint   HIV follow-up  Lesion of chin    HPI  In addition to details in A&P above:       Diagnosed with HIV 03/12/2019 and started on Biktarvy shortly after. His CD4 at diagnosis was 241/20% and VL was 34,583.     He was last seen 02/2020, at which time his viral load was undetectable. He is taking Biktarvy daily in the morning- he may miss 1-2 doses a month if he is rushing in the morning.  He hasn't had any long lapses in treatment since his last visit.  He isn't taking any other medications or supplements.     He had an epidermal inclusion cyst that was removed in May.     He used the imiquimod cream for a few months after our last visit and found that the anal lesions greatly improved- he thinks they are gone, and hasn't had any itching, bleeding, pain or discomfort.    Past Medical History:   Diagnosis Date   ??? HIV disease (CMS-HCC)    ??? 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 He is also working at [employer].   ??  Tobacco - He reports that he has been smoking e-cigarettes. He has been smoking about 0.25 packs per day. He has never used smokeless tobacco.   ??  Substance use - previous cocaine    Review of Systems  As per HPI. All others negative.  Medications and Allergies  He has a current medication list which includes the following prescription(s): biktarvy and doxycycline.    Allergies: Patient has no known allergies.      Family History  His family history includes Alzheimer's disease in his maternal grandfather and paternal grandfather; Crohn's disease in his father; Diabetes in his maternal grandmother; Heart failure in his maternal grandmother; Lymphoma in his paternal grandmother.     Objective      BP 113/73  - Pulse 92  - Ht 170.2 cm (5' 7)  - Wt 82.2 kg (181 lb 3.2 oz)  - BMI 28.38 kg/m??      Physical Exam  Vitals reviewed.   Constitutional:       Appearance: Normal appearance.   HENT:      Head:        Mouth/Throat:      Mouth: Mucous membranes are moist.      Pharynx: Oropharynx is clear.   Cardiovascular:      Rate and Rhythm: Normal rate and regular rhythm.   Pulmonary:      Effort: Pulmonary effort is normal.      Breath sounds: Normal breath sounds.   Skin:     General: Skin is warm and dry.   Neurological:      General: No focal deficit present.      Mental Status: He is alert.   Psychiatric:         Mood and Affect: Mood normal.         Behavior: Behavior normal.         Thought Content: Thought content normal.         Judgment: Judgment normal.

## 2020-11-04 NOTE — Unmapped (Addendum)
It was great to see you today.    - Continue Biktarvy every day  - For the area on your chin, you can take doxycyline twice a day for 7 days. Continue to use a warm compress 3-4 times a day.  - Labs today  - Please schedule nurse visit for vaccines (pneumonia and flu)  - Follow-up in 6 months    Please note:  New Clinic Location at [facility]: [address] [place], [place] [address]    Contacting us   During working hours  [phone]  After hours or weekends ([phone]) [phone] and ask for the ID doctor on call  Fax number   [phone]    MEDICATIONS  For refills, please contact your pharmacy and ask them to electronically send or fax the request to the clinic.     Please bring all medications in original bottles to every appointment.    HMAP (formerly ADAP) or [employer] Eligibility (required even if you do not receive medication through [facility])  Please remember to renew your Juanell Fairly eligibility during renewal periods which occur twice a year: January-March and July-September.     The following are needed for each renewal:   - [facility] Identification (if you don't have one, then a bill with your name and address in [place])   - proof of income (award letter, W-2, or last three check stubs)   If you are unable to come in for renewal, let us know if we can mail, fax or e-mail paperwork to you.   HMAP Contact: [phone]      Urgent Care Clinic  Monday, Tuesday, and Thursday from 8:30 - 12 noon  Please call ahead to speak with the nursing staff if you think you need to be seen urgently!    Lab info:  Your most recent CD4 T-cell counts and viral loads are below. Here are a few things to keep in mind when looking at your numbers:  ?? For most people, we're checking CD4 counts fairly infrequently (once a year or less)  ?? It's normal for your CD4 count to be different from visit to visit.   ?? We consider your viral load to be undetectable if it says <40 or if it says Not detected.  ?? Our goal is to get your virus to be undetectable and keep it undetectable. You can help by taking your medications at about the same time, every single day. If you're having trouble with taking your medications, it's important to let us know.    YOUR RECENT LAB RESULTS:  CD4 and VIRAL LOAD  Lab Results   Component Value Date    ACD4 546 12/23/2019    HIVRS Not Detected 02/26/2020         Patient Education        Skin Abscess: Care Instructions  Your Care Instructions     A skin abscess is a bacterial infection that forms a pocket of pus. A boil is a kind of skin abscess. The doctor may have cut an opening in the abscess so that the pus can drain out. You may have gauze in the cut so that the abscess will stay open and keep draining. You may need antibiotics. You will need to follow up with your doctor to make sure the infection has gone away.  The doctor has checked you carefully, but problems can develop later. If you notice any problems or new symptoms, get medical treatment right away.  Follow-up care is a key part of your treatment and safety. Be sure to make and go to all appointments, and call your doctor if you are having problems. It's also a good idea to know your test results and keep a list of the medicines you take.  How can you care for yourself at home?  ?? Apply warm and dry compresses, a heating pad set on low, or a hot water bottle 3 or 4 times a day for pain. Keep a cloth between the heat source and your skin.  ?? If your doctor prescribed antibiotics, take them as directed. Do not stop taking them just because you feel better. You need to take the full course of antibiotics.  ?? Take pain medicines exactly as directed.  ? If the doctor gave you a prescription medicine for pain, take it as prescribed.  ? If you are not taking a prescription pain medicine, ask your doctor if you can take an over-the-counter medicine.  ?? Keep your bandage clean and dry. Change the bandage whenever it gets wet or dirty, or at least one time a day.  ?? If the abscess was packed with gauze:  ? Keep follow-up appointments to have the gauze changed or removed. If the doctor instructed you to remove the gauze, follow the instructions you were given for how to remove it.  ? After the gauze is removed, soak the area in warm water for 15 to 20 minutes 2 times a day, until the wound closes.  When should you call for help?   Call your doctor now or seek immediate medical care if:  ?? ?? You have signs of worsening infection, such as:  ? Increased pain, swelling, warmth, or redness.  ? Red streaks leading from the infected skin.  ? Pus draining from the wound.  ? A fever.   Watch closely for changes in your health, and be sure to contact your doctor if:  ?? ?? You do not get better as expected.   Where can you learn more?  Go to [facility] at [email or web address]  Select Patient Education under [employer]. Enter [phone] in the search box to learn more about Skin Abscess: Care Instructions.  Current as of: January 21, 2020??????????????????????????????Content Version: 13.0  ?? 2006-2021 Healthwise, Incorporated.   Care instructions adapted under license by [facility]. If you have questions about a medical condition or this instruction, always ask your healthcare professional. Healthwise, Incorporated disclaims any warranty or liability for your use of this information.

## 2020-11-04 NOTE — Unmapped (Signed)
Started assessment with patient options: in clinic     HMAP application status: Incomplete Rw; need pay stubs and proof of residency    Insurance  BCBS through marketplace    Patient was informed of the following programs;   Other ; Manufacturer assistance    The following applications/handouts were given to patient:   N/A    The following forms were also started with the patient:   Other ; RW     Systems analyst Eligibility Assessment  Patient has insurance through [employer].     Additional Comments:       Rhetta Mura  ID Clinic Benefits Counselor  Time of Intervention-48mins

## 2020-11-05 NOTE — Unmapped (Addendum)
Patient with rectal chlamydia. Currently taking Doxycyline 100mg  BID, which is correct treatment. He needs to finish complete course, abstain from sex until 1 week post treatment, and notify sexual partners so they can be tested/treated.    Spoke to patient 12/17 to discuss above.    Will ask Nurse Imani to complete DIS form.

## 2020-11-10 LAB — HIV RNA, QUANTITATIVE, PCR: HIV RNA QNT RSLT: NOT DETECTED

## 2020-11-25 DIAGNOSIS — B20 Human immunodeficiency virus [HIV] disease: Principal | ICD-10-CM

## 2020-11-26 MED ORDER — BIKTARVY 50 MG-200 MG-25 MG TABLET
ORAL_TABLET | Freq: Every day | ORAL | 1 refills | 90 days
Start: 2020-11-26 — End: ?

## 2020-11-29 NOTE — Unmapped (Signed)
Patient said he would call back to reschedule his missed nurse visit from 1/7

## 2020-11-30 DIAGNOSIS — B20 Human immunodeficiency virus [HIV] disease: Principal | ICD-10-CM

## 2020-11-30 MED ORDER — BIKTARVY 50 MG-200 MG-25 MG TABLET
ORAL_TABLET | Freq: Every day | ORAL | 1 refills | 90.00000 days | Status: CP
Start: 2020-11-30 — End: ?
  Filled 2020-12-01: qty 30, 30d supply, fill #0

## 2020-11-30 NOTE — Unmapped (Signed)
[facility] Refill Coordination Note    Specialty Medication(s) to be Shipped:   Infectious Disease: Biktarvy    Other medication(s) to be shipped: No additional medications requested for fill at this time     Kevin [name], DOB: 16-May-1991  Phone: [phone] (home)       All above HIPAA information was verified with patient.     Was a Nurse, learning disability used for this call? No    Completed refill call assessment today to schedule patient's medication shipment from the [facility] [phone]).       Specialty medication(s) and dose(s) confirmed: Regimen is correct and unchanged.   Changes to medications: Kevin [name] reports no changes at this time.  Changes to insurance: No  Questions for the pharmacist: No    Confirmed patient received Welcome Packet with first shipment. The patient will receive a drug information handout for each medication shipped and additional FDA Medication Guides as required.       DISEASE/MEDICATION-SPECIFIC INFORMATION        N/A    SPECIALTY MEDICATION ADHERENCE     Medication Adherence    Patient reported X missed doses in the last month: 0  Specialty Medication: biktarvy 50-200-25mg   Patient is on additional specialty medications: No  Patient is on more than two specialty medications: No                biktarvy  : 5 days of medicine on hand         SHIPPING     Shipping address confirmed in Epic.     Delivery Scheduled: Yes, Expected medication delivery date: 1/13.     Medication will be delivered via Next Day Courier to the prescription address in Epic WAM.    Kevin [name]   [facility] Specialty Technician

## 2020-12-24 NOTE — Unmapped (Signed)
[facility] Clinical Assessment & Refill Coordination Note    Kevin [name], DOB: 03/15/1991  Phone: [phone] (home)     All above HIPAA information was verified with patient's family member, Refoel Palladino (father).     Was a Nurse, learning disability used for this call? No    Specialty Medication(s):   Infectious Disease: Biktarvy     Current Outpatient Medications   Medication Sig Dispense Refill   ??? bictegrav-emtricit-tenofov ala (BIKTARVY) 50-200-25 mg tablet Take 1 tablet by mouth daily. 90 tablet 1     No current facility-administered medications for this visit.        Changes to medications: no changes    No Known Allergies    Changes to allergies: No    SPECIALTY MEDICATION ADHERENCE     Biktarvy   :  9 days of medicine on hand     Medication Adherence    Patient reported X missed doses in the last month: 0  Specialty Medication: Biktarvy  Patient is on additional specialty medications: No  Any gaps in refill history greater than 2 weeks in the last 3 months: no  Demonstrates understanding of importance of adherence: yes  Informant: father  Reliability of informant: reliable  Provider-estimated medication adherence level: good  Patient is at risk for Non-Adherence: No          Specialty medication(s) dose(s) confirmed: Regimen is correct and unchanged.     Are there any concerns with adherence? No    Adherence counseling provided? Not needed    CLINICAL MANAGEMENT AND INTERVENTION      Clinical Benefit Assessment:    Do you feel the medicine is effective or helping your condition? Yes    HIV ASSOCIATED LABS:     Lab Results   Component Value Date/Time    HIVRS Not Detected 11/04/2020 10:35 AM    HIVRS Not Detected 02/26/2020 10:39 AM    HIVCP 86 (H) 12/23/2019 02:11 PM    HIVCP 34,583 (H) 03/12/2019 04:46 PM    ACD4 528 11/04/2020 10:36 AM    ACD4 546 12/23/2019 02:11 PM    ACD4 241 (L) 03/12/2019 04:46 PM       Clinical Benefit counseling provided? Labs from 11/04/20 show evidence of clinical benefit    Adverse Effects Assessment:    Are you experiencing any side effects? No    Are you experiencing difficulty administering your medicine? No    Quality of Life Assessment:    How many days over the past month did your HIV  keep you from your normal activities? For example, brushing your teeth or getting up in the morning. 0    Have you discussed this with your provider? Not needed    Therapy Appropriateness:    Is therapy appropriate? Yes, therapy is appropriate and should be continued    DISEASE/MEDICATION-SPECIFIC INFORMATION      N/A    PATIENT SPECIFIC NEEDS     - Does the patient have any physical, cognitive, or cultural barriers? No    - Is the patient high risk? No    - Does the patient require a Care Management Plan? No     - Does the patient require physician intervention or other additional services (i.e. nutrition, smoking cessation, social work)? No      SHIPPING     Specialty Medication(s) to be Shipped:   Infectious Disease: Biktarvy    Other medication(s) to be shipped: No additional medications requested for fill  at this time     Changes to insurance: No    Delivery Scheduled: Yes, Expected medication delivery date: 12/28/20.     Medication will be delivered via Next Day Courier to the confirmed prescription address in [facility].    The patient will receive a drug information handout for each medication shipped and additional FDA Medication Guides as required.  Verified that patient has previously received a Conservation officer, historic buildings.    All of the patient's questions and concerns have been addressed.    Roderic Palau   [facility] Specialty Pharmacist

## 2020-12-27 MED FILL — BIKTARVY 50 MG-200 MG-25 MG TABLET: ORAL | 30 days supply | Qty: 30 | Fill #1

## 2021-01-25 NOTE — Unmapped (Signed)
[facility] Refill Coordination Note    Specialty Medication(s) to be Shipped:   Infectious Disease: Biktarvy    Other medication(s) to be shipped: No additional medications requested for fill at this time     Kevin [name], DOB: [birth year]  Phone: [phone] (home)       All above HIPAA information was verified with patient.     Was a Nurse, learning disability used for this call? No    Completed refill call assessment today to schedule patient's medication shipment from the [facility] [phone]).       Specialty medication(s) and dose(s) confirmed: Regimen is correct and unchanged.   Changes to medications: Kevin [name] reports no changes at this time.  Changes to insurance: No  Questions for the pharmacist: No    Confirmed patient received Welcome Packet with first shipment. The patient will receive a drug information handout for each medication shipped and additional FDA Medication Guides as required.       DISEASE/MEDICATION-SPECIFIC INFORMATION        N/A    SPECIALTY MEDICATION ADHERENCE     Medication Adherence    Patient reported X missed doses in the last month: 0  Specialty Medication: biktarvy                biktarvy  : 5 days of medicine on hand         SHIPPING     Shipping address confirmed in Epic.     Delivery Scheduled: Yes, Expected medication delivery date: 3/11.     Medication will be delivered via Next Day Courier to the prescription address in Epic [facility].    Kevin [name]   [facility] Specialty Technician

## 2021-01-27 MED FILL — BIKTARVY 50 MG-200 MG-25 MG TABLET: ORAL | 30 days supply | Qty: 30 | Fill #2

## 2021-02-22 NOTE — Unmapped (Signed)
[facility] Refill Coordination Note    Specialty Medication(s) to be Shipped:   Infectious Disease: Biktarvy    Other medication(s) to be shipped: No additional medications requested for fill at this time     Kevin [name], DOB: 1990-12-29  Phone: [phone] (home)       All above HIPAA information was verified with patient's family member, dad.     Was a Nurse, learning disability used for this call? No    Completed refill call assessment today to schedule patient's medication shipment from the [facility] [phone]).       Specialty medication(s) and dose(s) confirmed: Regimen is correct and unchanged.   Changes to medications: [name] reports no changes at this time.  Changes to insurance: No  Questions for the pharmacist: No    Confirmed patient received a Conservation officer, historic buildings and a Surveyor, mining with first shipment. The patient will receive a drug information handout for each medication shipped and additional FDA Medication Guides as required.       DISEASE/MEDICATION-SPECIFIC INFORMATION        N/A    SPECIALTY MEDICATION ADHERENCE     Medication Adherence    Patient reported X missed doses in the last month: 0  Specialty Medication: BIKTARVY 50-200-25MG   Patient is on additional specialty medications: No  Patient is on more than two specialty medications: No  Any gaps in refill history greater than 2 weeks in the last 3 months: no  Demonstrates understanding of importance of adherence: yes  Informant: father  Reliability of informant: reliable  Provider-estimated medication adherence level: good  Patient is at risk for Non-Adherence: No                biktarvy 50-200-25 mg: 5 days of medicine on hand         SHIPPING     Shipping address confirmed in Epic.     Delivery Scheduled: Yes, Expected medication delivery date: 04/07.     Medication will be delivered via Same Day Courier to the prescription address in Epic [facility].    [name]   [facility] Specialty Technician

## 2021-02-24 MED FILL — BIKTARVY 50 MG-200 MG-25 MG TABLET: ORAL | 30 days supply | Qty: 30 | Fill #3

## 2021-02-25 NOTE — Unmapped (Signed)
scheduled patient a follow up appointment 6/15 @ 930

## 2021-03-29 NOTE — Unmapped (Signed)
[facility] Refill Coordination Note    Specialty Medication(s) to be Shipped:   Infectious Disease: Biktarvy    Other medication(s) to be shipped: No additional medications requested for fill at this time     Kevin [name], DOB: [birth year]  Phone: [phone] (home)       All above HIPAA information was verified with patient.     Was a Nurse, learning disability used for this call? No    Completed refill call assessment today to schedule patient's medication shipment from the [facility] [phone]).  All relevant notes have been reviewed.     Specialty medication(s) and dose(s) confirmed: Regimen is correct and unchanged.   Changes to medications: Ari reports no changes at this time.  Changes to insurance: No  New side effects reported not previously addressed with a pharmacist or physician: None reported  Questions for the pharmacist: No    Confirmed patient received a Conservation officer, historic buildings and a Surveyor, mining with first shipment. The patient will receive a drug information handout for each medication shipped and additional FDA Medication Guides as required.       DISEASE/MEDICATION-SPECIFIC INFORMATION        N/A    SPECIALTY MEDICATION ADHERENCE     Medication Adherence    Patient reported X missed doses in the last month: 0  Specialty Medication: Biktarvy  Patient is on additional specialty medications: No              Were doses missed due to medication being on hold? No    biktarvy  : 5 days of medicine on hand       REFERRAL TO PHARMACIST     Referral to the pharmacist: Not needed      [facility]     Shipping address confirmed in Epic.     Delivery Scheduled: Yes, Expected medication delivery date: 5/12.     Medication will be delivered via Next Day Courier to the prescription address in Epic W[facility].    Westley Gambles   [facility] Specialty Technician

## 2021-03-30 MED FILL — BIKTARVY 50 MG-200 MG-25 MG TABLET: ORAL | 30 days supply | Qty: 30 | Fill #4

## 2021-04-25 NOTE — Unmapped (Signed)
[facility] Refill Coordination Note    Specialty Medication(s) to be Shipped:   Infectious Disease: Biktarvy    Other medication(s) to be shipped: No additional medications requested for fill at this time     Kevin [name], DOB: 1991/11/13  Phone: [phone] (home)       All above HIPAA information was verified with patient's family member, dad, Kevin [name].     Was a Nurse, learning disability used for this call? No    Completed refill call assessment today to schedule patient's medication shipment from the [facility] [phone]).  All relevant notes have been reviewed.     Specialty medication(s) and dose(s) confirmed: Regimen is correct and unchanged.   Changes to medications: Kevin [name] reports no changes at this time.  Changes to insurance: No  New side effects reported not previously addressed with a pharmacist or physician: None reported  Questions for the pharmacist: No    Confirmed patient received a Conservation officer, historic buildings and a Surveyor, mining with first shipment. The patient will receive a drug information handout for each medication shipped and additional FDA Medication Guides as required.       DISEASE/MEDICATION-SPECIFIC INFORMATION        N/A    SPECIALTY MEDICATION ADHERENCE     Medication Adherence    Patient reported X missed doses in the last month: 0  Specialty Medication: biktarvy              Were doses missed due to medication being on hold? No    biktarvy  : 6 days of medicine on hand       REFERRAL TO PHARMACIST     Referral to the pharmacist: Not needed      Kevin [name]     Shipping address confirmed in Epic.     Delivery Scheduled: Yes, Expected medication delivery date: 6/9.     Medication will be delivered via Next Day Courier to the prescription address in Epic W[facility].    Kevin [name]   [facility] Specialty Technician

## 2021-04-27 MED FILL — BIKTARVY 50 MG-200 MG-25 MG TABLET: ORAL | 30 days supply | Qty: 30 | Fill #5

## 2021-05-04 ENCOUNTER — Encounter: Admit: 2021-05-04 | Discharge: 2021-05-04 | Payer: PRIVATE HEALTH INSURANCE

## 2021-05-04 DIAGNOSIS — Z113 Encounter for screening for infections with a predominantly sexual mode of transmission: Principal | ICD-10-CM

## 2021-05-04 DIAGNOSIS — R768 Other specified abnormal immunological findings in serum: Principal | ICD-10-CM

## 2021-05-04 DIAGNOSIS — B20 Human immunodeficiency virus [HIV] disease: Principal | ICD-10-CM

## 2021-05-04 DIAGNOSIS — R222 Localized swelling, mass and lump, trunk: Principal | ICD-10-CM

## 2021-05-04 LAB — LYMPH MARKER LIMITED,FLOW
ABSOLUTE CD3 CNT: 730 {cells}/uL — ABNORMAL LOW (ref 915–3400)
ABSOLUTE CD4 CNT: 360 {cells}/uL — ABNORMAL LOW (ref 510–2320)
ABSOLUTE CD8 CNT: 330 {cells}/uL (ref 180–1520)
CD3% (T CELLS): 73 % (ref 61–86)
CD4% (T HELPER): 36 % (ref 34–58)
CD4:CD8 RATIO: 1.1 (ref 0.9–4.8)
CD8% T SUPPRESR: 33 % (ref 12–38)

## 2021-05-04 LAB — CBC W/ AUTO DIFF
BASOPHILS ABSOLUTE COUNT: 0 10*9/L (ref 0.0–0.1)
BASOPHILS RELATIVE PERCENT: 0.4 %
EOSINOPHILS ABSOLUTE COUNT: 0 10*9/L (ref 0.0–0.5)
EOSINOPHILS RELATIVE PERCENT: 0.6 %
HEMATOCRIT: 42.8 % (ref 39.0–48.0)
HEMOGLOBIN: 15.1 g/dL (ref 12.9–16.5)
LYMPHOCYTES ABSOLUTE COUNT: 1 10*9/L — ABNORMAL LOW (ref 1.1–3.6)
LYMPHOCYTES RELATIVE PERCENT: 13.2 %
MEAN CORPUSCULAR HEMOGLOBIN CONC: 35.3 g/dL (ref 32.0–36.0)
MEAN CORPUSCULAR HEMOGLOBIN: 31.1 pg (ref 25.9–32.4)
MEAN CORPUSCULAR VOLUME: 88.3 fL (ref 77.6–95.7)
MEAN PLATELET VOLUME: 9.2 fL (ref 6.8–10.7)
MONOCYTES ABSOLUTE COUNT: 0.6 10*9/L (ref 0.3–0.8)
MONOCYTES RELATIVE PERCENT: 7.5 %
NEUTROPHILS ABSOLUTE COUNT: 5.7 10*9/L (ref 1.8–7.8)
NEUTROPHILS RELATIVE PERCENT: 78.3 %
NUCLEATED RED BLOOD CELLS: 0 /100{WBCs} (ref <=4)
PLATELET COUNT: 215 10*9/L (ref 150–450)
RED BLOOD CELL COUNT: 4.85 10*12/L (ref 4.26–5.60)
RED CELL DISTRIBUTION WIDTH: 12.7 % (ref 12.2–15.2)
WBC ADJUSTED: 7.3 10*9/L (ref 3.6–11.2)

## 2021-05-04 LAB — BASIC METABOLIC PANEL
ANION GAP: 8 mmol/L (ref 5–14)
BLOOD UREA NITROGEN: 10 mg/dL (ref 9–23)
BUN / CREAT RATIO: 13
CALCIUM: 9.5 mg/dL (ref 8.7–10.4)
CHLORIDE: 105 mmol/L (ref 98–107)
CO2: 24.8 mmol/L (ref 20.0–31.0)
CREATININE: 0.76 mg/dL
EGFR CKD-EPI (2021) MALE: >90 mL/min/{1.73_m2} (ref >=60)
GLUCOSE RANDOM: 90 mg/dL (ref 70–179)
POTASSIUM: 3.7 mmol/L (ref 3.4–4.8)
SODIUM: 138 mmol/L (ref 135–145)

## 2021-05-04 LAB — AST: AST (SGOT): 12 U/L (ref <=34)

## 2021-05-04 LAB — BILIRUBIN, TOTAL: BILIRUBIN TOTAL: 0.7 mg/dL (ref 0.3–1.2)

## 2021-05-04 LAB — ALT: ALT (SGPT): 25 U/L (ref 10–49)

## 2021-05-04 MED ORDER — BIKTARVY 50 MG-200 MG-25 MG TABLET
ORAL_TABLET | Freq: Every day | ORAL | 1 refills | 90 days | Status: CP
Start: 2021-05-04 — End: ?
  Filled 2021-05-25: qty 30, 30d supply, fill #0

## 2021-05-04 NOTE — Unmapped (Signed)
INFECTIOUS DISEASES CLINIC  [address]  [place], [place]  [address]  P [phone]  F [phone]     Primary care provider: Renesme Kerrigan Nonda [name], [place]    Assessment/Plan:      HIV (dx'd 02/2019, nadir CD4 241 / 20% in 02/2019)  - chronic, stable  Overall Kevin [name] is doing well, with his current regimen: Biktarvy (BIC/FTC/TAF)  Misses doses of ARVs rarely (1-2x/month).  - He is not interested in long acting injectable ART at this time.  - Med access through insurance  - CD4 count over 200 for >16m; primary prophylaxis for PCP and toxo no longer needed  - Discussed ARV adherence, injectable ARVs and spacing cationic compounds from INIs    Lab Results   Component Value Date    ACD4 528 11/04/2020    CD4% 33% (L) 11/04/2020    HIVRS Not Detected 11/04/2020     ??? CD4, HIV RNA, and safety labs  ??? Continue current therapy of Biktarvy.  ??? Encouraged continued excellent ARV adherence      Nodule of buttock  - 2 month history of recurrent nodule in right lower aspect of buttock with occasional drainage. He reports cycle of being asymptomatic, then pain with drainage, then returning to an asymptomatic nodule. On exam, <1cm non-tender, flesh colored nodule with central punctum on right lower buttock.  - Does not appear to be an abscess or have signs of active infection today. May be an epidermal inclusion cyst vs dermatofibroma (although less likely given pain) vs other. Will refer to Dermatology for evaluation and removal.   - He will go to local dermatologist in [place].       Sexual health & secondary prevention  Sexually active with men. He has 1 partner, but they are not monogamous. He had rectal chlamydia in December- reports he and his partner were treated.  - He is asymptomatic today.  - Requests STI testing.   ??? GC/CT NAATs - obtained today from all exposed anatomical site(s)  ??? RPR - for screening obtained today  ???   Lab Results   Component Value Date    RPR Nonreactive 11/04/2020    RPR Nonreactive 02/26/2020    CTNAA Negative 11/04/2020    CTNAA Negative 11/04/2020    CTNAA Positive (A) 11/04/2020    GCNAA Negative 11/04/2020    GCNAA Negative 11/04/2020    GCNAA Negative 11/04/2020    SPECSOURCE Urine 11/04/2020    SPECSOURCE Throat 11/04/2020    SPECSOURCE Rectum 11/04/2020     Health maintenance      Metabolic conditions  Wt Readings from Last 5 Encounters:   05/04/21 80.1 kg (176 lb 9.6 oz)   11/04/20 82.2 kg (181 lb 3.2 oz)   03/22/20 78.9 kg (173 lb 14.4 oz)   02/26/20 78 kg (172 lb)   12/23/19 79.6 kg (175 lb 8 oz)     Lab Results   Component Value Date    CREATININE 0.80 11/04/2020    GLUCOSEU Negative 03/12/2019    GLU 101 11/04/2020    A1C 5.0 03/12/2019    ALT 33 11/04/2020    ALT 39 12/23/2019    ALT 61 (H) 03/12/2019     # Kidney health - creatinine today  # Bone health - assessment not yet needed (under age 45)  # Diabetes assessment - random glucose today  # NAFLD assessment - suspicion for NAFLD low    Communicable diseases  Lab Results   Component  Value Date    QFTTBGOLD Negative 03/12/2019    HEPAIGG Nonreactive 03/12/2019    HEPBSAB Nonreactive 03/12/2019    HEPCAB Reactive (A) 03/12/2019    HCVRNA Not Detected 12/23/2019     # TB screening - no longer needed; negative IGRA, low risk  # Hepatitis screening - HCV RNA    Cancer screening  Lab Results   Component Value Date    FINALDX  04/06/2020     A: Soft tissue, left neck, excision  - Epidermal cyst, pilar type    This electronic signature is attestation that the pathologist personally reviewed the submitted material(s) and the final diagnosis reflects that evaluation.       Cardiovascular disease  Lab Results   Component Value Date    CHOL 169 03/12/2019    HDL 26 (L) 03/12/2019    LDL 123 (H) 03/12/2019    NONHDL 143 03/12/2019    TRIG 100 03/12/2019     Immunization History   Administered Date(s) Administered   ??? COVID-19 VACCINE,MRNA(MODERNA)(PF)(IM) 08/03/2020       ??? Immunizations today - Discussed recommended vaccines but he declines all today.   o He is due for Pneumonia vaccine, 2nd shot of Covid primary vaccine series, Hep A, Hep B, HPV, Meningococcal and possible Tdap (if not in the last 10 years).    I personally spent 25 minutes face-to-face and non-face-to-face in the care of this patient, which includes all pre, intra, and post visit time on the date of service.    Disposition  Next appointment: 5-6 months    Kevin Rocker, PA-C  [facility]   [address]  [place], [place].  [address]  Phone: [phone]   Fax: [phone]      Subjective      Chief Complaint   HIV     HPI  In addition to details in A&P above:    Kevin [name] is here for HIV follow-up today. He takes Radio producer daily in the morning before work- he will miss 1-2 doses per month, usually because he's rushing in the morning    His main concern today is a draining nodule on his buttock that has been present for 2 months. He reports it starts painless but occasionally will get painful then drain. He is using a warm compress or heating pad, which helps a bit, but the pain/discharge will recur every 2-3 weeks. He tries not to manipulate it/scratch or pick at it, but it often rubs his clothes due to the location.   He has a history of EIC in neck last year.     No fever, chills, myalgias, other rashes, urinary changes, rectal pain, throat pain.     Past Medical History:   Diagnosis Date   ??? HIV disease (CMS-HCC)    ??? 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 He works at [employer].   ??  Tobacco -??He??reports that he has been smoking e-cigarettes. He has been smoking about 0.25 packs per day. He has never used smokeless tobacco.??  ??  Substance use - previous cocaine    Review of Systems  As per HPI. All others negative.    Medications and Allergies  He has a current medication list which includes the following prescription(s): biktarvy.    Allergies: Patient  has no known allergies.    Family History  His family history includes Alzheimer's disease in his maternal grandfather and paternal grandfather; Crohn's disease in his father; Diabetes in his maternal grandmother; Heart failure in his maternal grandmother; Lymphoma in his paternal grandmother.     Objective      BP 124/76  - Pulse 100  - Temp 36.8 ??C (98.3 ??F) (Oral)  - Ht 170.2 cm (5' 7)  - Wt 80.1 kg (176 lb 9.6 oz)  - BMI 27.66 kg/m??      Physical Exam  Vitals reviewed.   Constitutional:       Appearance: Normal appearance.   Cardiovascular:      Rate and Rhythm: Normal rate and regular rhythm.   Pulmonary:      Effort: Pulmonary effort is normal.      Breath sounds: Normal breath sounds.   Genitourinary:     Comments: R lower buttock with 5-35mm non-tender nodule with central indentation with slight crusting. No fluctuance, no induration. Nodule is otherwise flesh-colored. No erythema or active drainage.   Lymphadenopathy:      Head:      Right side of head: No submental, submandibular, tonsillar, preauricular, posterior auricular or occipital adenopathy.      Left side of head: No submental, submandibular, tonsillar, preauricular, posterior auricular or occipital adenopathy.      Cervical: No cervical adenopathy.   Neurological:      Mental Status: He is alert.   Psychiatric:         Mood and Affect: Mood normal.         Behavior: Behavior normal.         Thought Content: Thought content normal.         Judgment: Judgment normal.

## 2021-05-04 NOTE — Unmapped (Signed)
You and I are Partners in Your care!  Your most important part of the partnership is for you to come to your appointments and take your medications.  My job is to make sure you are on the safest, most effective treatment possible.    If you have trouble getting your medications - PLEASE let us know.    MyChart is the best way to communicate with [place]. I try to check this every week day.  If you don't have MyChart please let us know so we can assist you in setting it up.    Please try to arrive 30 minutes BEFORE your scheduled appointment time!  This will give you time to fill out any front desk paperwork needed for your visit, and allow you to be seen as close to your scheduled appointment time as possible.    Keeping  updated for COVID-19 vaccine is important - if you have questions please ask.  There is treatment for COVID-19 and you are likely to be eligible. The best treatment for you may be a pill to take for 5 days -   Get tested if you have symptoms and let us know RIGHT AWAY if you are positive    Your Most Recent CD4 T-cell Counts and Virus Level (not detected or < 40 is where you want to be)  Here are a couple of things to keep in mind when looking at your numbers:  For most people, we're checking CD4 counts every other visit (once or twice a year).  It's normal for your CD4 count to be different from visit to visit.     Absolute CD4 Count (/uL)   Date Value Status   11/04/2020 528 Final     CD4% (T Helper) (%)   Date Value Status   11/04/2020 33 (L) Final     HIV RNA Quant Result (no units)   Date Value Status   11/04/2020 Not Detected Final     HIV RNA (copies/mL)   Date Value Status   12/23/2019 86 (H) Final       Contacting us   During working hours  [phone]  After hours or weekends [phone] and ask for the ID doctor on call  Fax number   [phone]    MEDICATIONS  For refills, please contact your pharmacy and ask them to electronically send or fax the request to the clinic.     Please bring all medications in original bottles to every appointment.    [facility] (formerly [facility]) or [employer] Eligibility (required even if you do not receive medication through [facility])  Please remember to renew your Juanell Fairly eligibility during renewal periods which occur twice a year: January-March and July-September.     The following are needed for each renewal:   - [facility] Identification (if you don't have one, then a bill with your name and address in [place])   - proof of income (award letter, W-2, or last three check stubs)   If you are unable to come in for renewal, let us know if we can mail, fax or e-mail paperwork to you.   [facility] Contact: [phone]

## 2021-05-05 LAB — SYPHILIS SCREEN: SYPHILIS RPR SCREEN: NONREACTIVE

## 2021-05-06 LAB — HIV RNA, QUANTITATIVE, PCR: HIV RNA QNT RSLT: NOT DETECTED

## 2021-05-07 LAB — HEPATITIS C RNA, QUANTITATIVE, PCR: HCV RNA: NOT DETECTED

## 2021-05-20 NOTE — Unmapped (Signed)
[facility] Shared [facility] Clinical Assessment & Refill Coordination Note    Kevin [name], DOB: Apr 12, 1991  Phone: [phone] (home)     All above HIPAA information was verified with patient's family member, Blaire Hodsdon (father).     Was a Nurse, learning disability used for this call? No    Specialty Medication(s):   Infectious Disease: Biktarvy     Current Outpatient Medications   Medication Sig Dispense Refill   ??? bictegrav-emtricit-tenofov ala (BIKTARVY) 50-200-25 mg tablet Take 1 tablet by mouth daily. 90 tablet 1     No current facility-administered medications for this visit.        Changes to medications: no changes    No Known Allergies    Changes to allergies: No    SPECIALTY MEDICATION ADHERENCE     Biktarvy 50-200-25 mg: approximately 7 days of medicine on hand       Medication Adherence    Patient reported X missed doses in the last month: 0  Specialty Medication: Biktarvy 50-200-25mg   Patient is on additional specialty medications: No  Any gaps in refill history greater than 2 weeks in the last 3 months: no  Demonstrates understanding of importance of adherence: yes  Informant: father  Reliability of informant: reliable  Provider-estimated medication adherence level: good  Patient is at risk for Non-Adherence: No          Specialty medication(s) dose(s) confirmed: Regimen is correct and unchanged.     Are there any concerns with adherence? No    Adherence counseling provided? Not needed    CLINICAL MANAGEMENT AND INTERVENTION      Clinical Benefit Assessment:    Do you feel the medicine is effective or helping your condition? Yes    HIV ASSOCIATED LABS:     Lab Results   Component Value Date/Time    HIVRS Not Detected 05/04/2021 10:54 AM    HIVRS Not Detected 11/04/2020 10:35 AM    HIVRS Not Detected 02/26/2020 10:39 AM    HIVCP 86 (H) 12/23/2019 02:11 PM    HIVCP 34,583 (H) 03/12/2019 04:46 PM    ACD4 360 (L) 05/04/2021 10:54 AM    ACD4 528 11/04/2020 10:36 AM    ACD4 546 12/23/2019 02:11 PM Clinical Benefit counseling provided? Labs from 05/04/21 show evidence of clinical benefit    Adverse Effects Assessment:    Are you experiencing any side effects? No    Are you experiencing difficulty administering your medicine? No    Quality of Life Assessment:    How many days over the past month did your HIV  keep you from your normal activities? For example, brushing your teeth or getting up in the morning. 0    Have you discussed this with your provider? Not needed    Acute Infection Status:    Acute infections noted within Epic:  No active infections  Patient reported infection: None    Therapy Appropriateness:    Is therapy appropriate? Yes, therapy is appropriate and should be continued    DISEASE/MEDICATION-SPECIFIC INFORMATION      N/A    PATIENT SPECIFIC NEEDS     - Does the patient have any physical, cognitive, or cultural barriers? No    - Is the patient high risk? No    - Does the patient require a Care Management Plan? No     - Does the patient require physician intervention or other additional services (i.e. nutrition, smoking cessation, social work)? No      SHIPPING  Specialty Medication(s) to be Shipped:   Infectious Disease: Biktarvy    Other medication(s) to be shipped: No additional medications requested for fill at this time     Changes to insurance: No    Delivery Scheduled: Yes, Expected medication delivery date: 05/25/21.     Medication will be delivered via Next Day Courier to the confirmed prescription address in [facility].    The patient will receive a drug information handout for each medication shipped and additional FDA Medication Guides as required.  Verified that patient has previously received a Conservation officer, historic buildings and a Surveyor, mining.    The patient or caregiver noted above participated in the development of this care plan and knows that they can request review of or adjustments to the care plan at any time.      All of the patient's questions and concerns have been addressed.    Roderic Palau   [facility] Shared [facility] Specialty Pharmacist

## 2021-06-27 NOTE — Unmapped (Signed)
[facility] Refill Coordination Note    Specialty Medication(s) to be Shipped:   Infectious Disease: Biktarvy    Other medication(s) to be shipped: No additional medications requested for fill at this time     Kevin [name], DOB: 1991/10/20  Phone: [phone] (home)       All above HIPAA information was verified with patient's family member, father Kevin [name].     Was a Nurse, learning disability used for this call? No    Completed refill call assessment today to schedule patient's medication shipment from the [facility] [phone]).  All relevant notes have been reviewed.     Specialty medication(s) and dose(s) confirmed: Regimen is correct and unchanged.   Changes to medications: Kevin [name] reports no changes at this time.  Changes to insurance: No  New side effects reported not previously addressed with a pharmacist or physician: None reported  Questions for the pharmacist: No    Confirmed patient received a Conservation officer, historic buildings and a Surveyor, mining with first shipment. The patient will receive a drug information handout for each medication shipped and additional FDA Medication Guides as required.       DISEASE/MEDICATION-SPECIFIC INFORMATION        N/A    SPECIALTY MEDICATION ADHERENCE     Medication Adherence    Patient reported X missed doses in the last month: 0  Specialty Medication: biktarvy 50-200-25mg   Patient is on additional specialty medications: No  Patient is on more than two specialty medications: No              Were doses missed due to medication being on hold? No    Unable to confirm quantity on hand    REFERRAL TO PHARMACIST     Referral to the pharmacist: Not needed      [facility]     Shipping address confirmed in Epic.     Delivery Scheduled: Yes, Expected medication delivery date: 8/10.     Medication will be delivered via Next Day Courier to the prescription address in Epic WAM.    Kevin [name]   [facility] Specialty Technician

## 2021-06-28 MED FILL — BIKTARVY 50 MG-200 MG-25 MG TABLET: ORAL | 30 days supply | Qty: 30 | Fill #1

## 2021-07-22 NOTE — Unmapped (Signed)
[facility] Special[facility] Pharm[facility] Refill Coordination Note    Specialty Medication(s) to be Shipped:   Infectious Disease: Biktarvy    Other medication(s) to be shipped: No additional medications requested for fill at this time     Kevin [name], DOB: August 06, 1991  Phone: [phone] (home)       All above HIPAA information was verified with patient's family member, dad.     Was a Nurse, learning disability used for this call? No    Completed refill call assessment today to schedule patient's medication shipment from the [facility] [phone]).  All relevant notes have been reviewed.     Specialty medication(s) and dose(s) confirmed: Regimen is correct and unchanged.   Changes to medications: Kevin [name] reports no changes at this time.  Changes to insurance: No  New side effects reported not previously addressed with a pharmacist or physician: None reported  Questions for the pharmacist: No    Confirmed patient received a Conservation officer, historic buildings and a Surveyor, mining with first shipment. The patient will receive a drug information handout for each medication shipped and additional FDA Medication Guides as required.       DISEASE/MEDICATION-SPECIFIC INFORMATION        N/A    SPECIALTY MEDICATION ADHERENCE     Medication Adherence    Patient reported X missed doses in the last month: 0  Specialty Medication: biktarvy              Were doses missed due to medication being on hold? No    biktarvy  : 5 days of medicine on hand       REFERRAL TO PHARMACIST     Referral to the pharmacist: Not needed      [facility]     Shipping address confirmed in Epic.     Delivery Scheduled: Yes, Expected medication delivery date: 9/7.     Medication will be delivered via Same Day Courier to the prescription address in Epic WAM.    Westley Gambles   [facility] Specialty Technician

## 2021-07-27 MED FILL — BIKTARVY 50 MG-200 MG-25 MG TABLET: ORAL | 30 days supply | Qty: 30 | Fill #2

## 2021-08-19 NOTE — Unmapped (Signed)
 [facility]    Specialty Medication(s) to be Shipped:   Infectious Disease: Biktarvy    Other medication(s) to be shipped: No additional medications requested for fill at this time     Kevin [name], DOB: Oct 19, 1991  Phone: [phone] (home)       All above HIPAA information was verified with patient's family member, dad.     Was a Nurse, learning disability used for this call? No    Completed refill call assessment today to schedule patient's medication shipment from the [facility] [phone]).  All relevant notes have been reviewed.     Specialty medication(s) and dose(s) confirmed: Regimen is correct and unchanged.   Changes to medications: Isiaah reports no changes at this time.  Changes to insurance: No  New side effects reported not previously addressed with a pharmacist or physician: None reported  Questions for the pharmacist: No    Confirmed patient received a Conservation officer, historic buildings and a Surveyor, mining with first shipment. The patient will receive a drug information handout for each medication shipped and additional FDA Medication Guides as required.       DISEASE/MEDICATION-SPECIFIC INFORMATION        N/A    SPECIALTY MEDICATION ADHERENCE     Medication Adherence    Patient reported X missed doses in the last month: 0  Specialty Medication: biktarvy 50-200-25mg   Patient is on additional specialty medications: No  Patient is on more than two specialty medications: No  Any gaps in refill history greater than 2 weeks in the last 3 months: no  Demonstrates understanding of importance of adherence: yes  Informant: father  Reliability of informant: reliable  Provider-estimated medication adherence level: good  Patient is at risk for Non-Adherence: No  Reasons for non-adherence: no problems identified              Were doses missed due to medication being on hold? No        biktarvy 50-200-25 mg: 8 days of medicine on hand     REFERRAL TO PHARMACIST Referral to the pharmacist: Not needed      [facility]     Shipping address confirmed in Epic.     Delivery Scheduled: Yes, Expected medication delivery date: 10/05.     Medication will be delivered via Next Day Courier to the prescription address in Epic WAM.    [name]   [facility] Specialty Technician

## 2021-08-23 MED FILL — BIKTARVY 50 MG-200 MG-25 MG TABLET: ORAL | 30 days supply | Qty: 30 | Fill #3

## 2021-09-20 NOTE — Unmapped (Signed)
[facility] Pharm[facility] Refill Coordination Note    Specialty Medication(s) to be Shipped:   Infectious Disease: Biktarvy    Other medication(s) to be shipped: No additional medications requested for fill at this time     Kevin [name], DOB: March 14, 1991  Phone: [phone] (home)       All above HIPAA information was verified with patient's family member, fatherNedra Hai.     Was a Nurse, learning disability used for this call? No    Completed refill call assessment today to schedule patient's medication shipment from the [facility] [phone]).  All relevant notes have been reviewed.     Specialty medication(s) and dose(s) confirmed: Regimen is correct and unchanged.   Changes to medications: [name] reports no changes at this time.  Changes to insurance: No  New side effects reported not previously addressed with a pharmacist or physician: None reported  Questions for the pharmacist: No    Confirmed patient received a Conservation officer, historic buildings and a Surveyor, mining with first shipment. The patient will receive a drug information handout for each medication shipped and additional FDA Medication Guides as required.       DISEASE/MEDICATION-SPECIFIC INFORMATION        N/A    SPECIALTY MEDICATION ADHERENCE     Medication Adherence    Patient reported X missed doses in the last month: 0  Specialty Medication: BIKTARVY  Patient is on additional specialty medications: No              Were doses missed due to medication being on hold? No    Biktarvy 50-200-25 mg: 5-6 days of medicine on hand        REFERRAL TO PHARMACIST     Referral to the pharmacist: Not needed      [facility]     Shipping address confirmed in Epic.     Delivery Scheduled: Yes, Expected medication delivery date: 09/23/21.     Medication will be delivered via Next Day Courier to the prescription address in Epic WAM.    Unk Lightning   [facility] Specialty Technician

## 2021-09-22 MED FILL — BIKTARVY 50 MG-200 MG-25 MG TABLET: ORAL | 30 days supply | Qty: 30 | Fill #4

## 2021-10-12 NOTE — Unmapped (Signed)
[facility] Shared [facility] Clinical Assessment & Refill Coordination Note    Kevin [name], DOB: [birth year]  Phone: [phone] (home)     All above HIPAA information was verified with patient's family member, Murl Golladay (father).     Was a Nurse, learning disability used for this call? No    Specialty Medication(s):   Infectious Disease: Biktarvy     Current Outpatient Medications   Medication Sig Dispense Refill   ??? bictegrav-emtricit-tenofov ala (BIKTARVY) 50-200-25 mg tablet Take 1 tablet by mouth daily. 90 tablet 1     No current facility-administered medications for this visit.        Changes to medications: no changes    No Known Allergies    Changes to allergies: No    SPECIALTY MEDICATION ADHERENCE     Biktarvy 50-200-25 mg: approximately  5 to 10 days of medicine on hand       Medication Adherence    Patient reported X missed doses in the last month: 0  Specialty Medication: Biktarvy 50-200-25mg   Patient is on additional specialty medications: No  Demonstrates understanding of importance of adherence: yes  Informant: father  Reliability of informant: reliable  Provider-estimated medication adherence level: good  Patient is at risk for Non-Adherence: No          Specialty medication(s) dose(s) confirmed: Regimen is correct and unchanged.     Are there any concerns with adherence? No    Adherence counseling provided? Not needed    CLINICAL MANAGEMENT AND INTERVENTION      Clinical Benefit Assessment:    Do you feel the medicine is effective or helping your condition? Yes    HIV ASSOCIATED LABS:     Lab Results   Component Value Date/Time    HIVRS Not Detected 05/04/2021 10:54 AM    HIVRS Not Detected 11/04/2020 10:35 AM    HIVRS Not Detected 02/26/2020 10:39 AM    HIVCP 86 (H) 12/23/2019 02:11 PM    HIVCP 34,583 (H) 03/12/2019 04:46 PM    ACD4 360 (L) 05/04/2021 10:54 AM    ACD4 528 11/04/2020 10:36 AM    ACD4 546 12/23/2019 02:11 PM       Clinical Benefit counseling provided? Labs from 05/04/21 show evidence of clinical benefit    Adverse Effects Assessment:    Are you experiencing any side effects? No    Are you experiencing difficulty administering your medicine? No    Quality of Life Assessment:    How many days over the past month did your HIV  keep you from your normal activities? For example, brushing your teeth or getting up in the morning. 0    Have you discussed this with your provider? Not needed    Acute Infection Status:    Acute infections noted within Epic:  No active infections  Patient reported infection: None    Therapy Appropriateness:    Is therapy appropriate and patient progressing towards therapeutic goals? Yes, therapy is appropriate and should be continued    DISEASE/MEDICATION-SPECIFIC INFORMATION      N/A    PATIENT SPECIFIC NEEDS     - Does the patient have any physical, cognitive, or cultural barriers? No    - Is the patient high risk? No    - Does the patient require a Care Management Plan? No     - Does the patient require physician intervention or other additional services (i.e. nutrition, smoking cessation, social work)? No      SHIPPING  Specialty Medication(s) to be Shipped:   Infectious Disease: Biktarvy    Other medication(s) to be shipped: No additional medications requested for fill at this time     Changes to insurance: No    Delivery Scheduled: Yes, Expected medication delivery date: 10/18/21.     Medication will be delivered via Same Day Courier to the confirmed prescription address in [facility].    The patient will receive a drug information handout for each medication shipped and additional FDA Medication Guides as required.  Verified that patient has previously received a Conservation officer, historic buildings and a Surveyor, mining.    The patient or caregiver noted above participated in the development of this care plan and knows that they can request review of or adjustments to the care plan at any time.      All of the patient's questions and concerns have been addressed.    Roderic Palau   [facility] Shared [facility] Specialty Pharmacist

## 2021-10-18 NOTE — Unmapped (Signed)
TANDRE CONLY 's Biktarvy shipment will be delayed as a result of the medication is too soon to refill until 11/30.     I have reached out to the patient  at [phone] and left a voicemail message.  We will wait for a call back from the patient to reschedule the delivery.  We have not confirmed the new delivery date.

## 2021-10-19 MED FILL — BIKTARVY 50 MG-200 MG-25 MG TABLET: ORAL | 30 days supply | Qty: 30 | Fill #5

## 2021-10-19 NOTE — Unmapped (Signed)
Towana Badger 's Biktarvy shipment will be sent out  as a result of the medication is no longer refill too soon.      I have reached out to the patient  at [phone] and communicated the delivery change. We will reschedule the medication for the delivery date that the patient agreed upon.  We have confirmed the delivery date as 11/30, via same day courier.

## 2021-11-10 DIAGNOSIS — B20 Human immunodeficiency virus [HIV] disease: Principal | ICD-10-CM

## 2021-11-10 MED ORDER — BIKTARVY 50 MG-200 MG-25 MG TABLET
ORAL_TABLET | Freq: Every day | ORAL | 0 refills | 30.00000 days | Status: CP
Start: 2021-11-10 — End: ?
  Filled 2021-11-18: qty 30, 30d supply, fill #0

## 2021-11-10 NOTE — Unmapped (Signed)
Refill request received for patient.  pharmacy     Medication Requested: Susanne Borders  Last Office Visit: 05/04/21  Next Office Visit:   6 mos recall in system- also called and left a voicemail for patient to call office to set up follow up appt.    Per Provider note:  ??? Continue current therapy of Biktarvy.    Will refill medication as per standing order protocol for only 30 day supply until can get appt to be seen.

## 2021-11-10 NOTE — Unmapped (Signed)
[facility] Special[facility] Pharmacy Refill Coordination Note    Specialty Medication(s) to be Shipped:   Infectious Disease: Biktarvy    Other medication(s) to be shipped: No additional medications requested for fill at this time     Kevin [name], DOB: [birth year]  Phone: [phone] (home)       All above HIPAA information was verified with patient's family member, dad.     Was a Nurse, learning disability used for this call? No    Completed refill call assessment today to schedule patient's medication shipment from the [facility] [phone]).  All relevant notes have been reviewed.     Specialty medication(s) and dose(s) confirmed: Regimen is correct and unchanged.   Changes to medications: [name] reports no changes at this time.  Changes to insurance: No  New side effects reported not previously addressed with a pharmacist or physician: None reported  Questions for the pharmacist: No    Confirmed patient received a Conservation officer, historic buildings and a Surveyor, mining with first shipment. The patient will receive a drug information handout for each medication shipped and additional FDA Medication Guides as required.       DISEASE/MEDICATION-SPECIFIC INFORMATION        N/A    SPECIALTY MEDICATION ADHERENCE     Medication Adherence    Patient reported X missed doses in the last month: 0  Specialty Medication: biktarvy              Were doses missed due to medication being on hold? No    biktarvy  : 7 days of medicine on hand       REFERRAL TO PHARMACIST     Referral to the pharmacist: Not needed      [facility]     Shipping address confirmed in Epic.     Delivery Scheduled: Yes, Expected medication delivery date: 12/29.     Medication will be delivered via Next Day Courier to the prescription address in Epic WAM.    Westley Gambles   [facility] Specialty Technician

## 2021-11-10 NOTE — Unmapped (Signed)
LM and sent text to schedule appointment

## 2021-11-16 NOTE — Unmapped (Signed)
Kevin [name] 's Biktarvy shipment will be delayed as a result of the medication is too soon to refill until 12/30.     I have reached out to the patients father at [phone] and communicated the delivery change. We will reschedule the medication for the delivery date that the patient agreed upon.  We have confirmed the delivery date as 12/30, via same day courier.

## 2021-11-30 NOTE — Unmapped (Signed)
LM to schedule next recall

## 2021-12-13 DIAGNOSIS — B20 Human immunodeficiency virus [HIV] disease: Principal | ICD-10-CM

## 2021-12-13 MED ORDER — BIKTARVY 50 MG-200 MG-25 MG TABLET
ORAL_TABLET | Freq: Every day | ORAL | 0 refills | 30 days | Status: CP
Start: 2021-12-13 — End: ?
  Filled 2021-12-19: qty 30, 30d supply, fill #0

## 2021-12-13 NOTE — Unmapped (Signed)
[facility] Specialty Pharmacy Refill Coordination Note    Specialty Medication(s) to be Shipped:   Infectious Disease: Biktarvy    Other medication(s) to be shipped: No additional medications requested for fill at this time     Kevin [name], DOB: [birth year]  Phone: [phone] (home)       All above HIPAA information was verified with patient's family member, dad.     Was a Nurse, learning disability used for this call? No    Completed refill call assessment today to schedule patient's medication shipment from the [facility] [phone]).  All relevant notes have been reviewed.     Specialty medication(s) and dose(s) confirmed: Regimen is correct and unchanged.   Changes to medications: Kevin [name] reports no changes at this time.  Changes to insurance: No  New side effects reported not previously addressed with a pharmacist or physician: None reported  Questions for the pharmacist: No    Confirmed patient received a Conservation officer, historic buildings and a Surveyor, mining with first shipment. The patient will receive a drug information handout for each medication shipped and additional FDA Medication Guides as required.       DISEASE/MEDICATION-SPECIFIC INFORMATION        N/A    SPECIALTY MEDICATION ADHERENCE     Medication Adherence    Patient reported X missed doses in the last month: 0  Specialty Medication: biktarvy 50-200-25mg   Patient is on additional specialty medications: No  Patient is on more than two specialty medications: No  Any gaps in refill history greater than 2 weeks in the last 3 months: no  Demonstrates understanding of importance of adherence: yes  Informant: father  Reliability of informant: reliable  Provider-estimated medication adherence level: good  Patient is at risk for Non-Adherence: No  Reasons for non-adherence: no problems identified  Confirmed plan for next specialty medication refill: delivery by pharmacy  Refills needed for supportive medications: yes, ordered or provider notified          Refill Coordination    Has the Patients' Contact Information Changed: No  Is the Shipping Address Different: No         Were doses missed due to medication being on hold? No    biktarvy 50-200-25 mg: 5 days of medicine on hand         REFERRAL TO PHARMACIST     Referral to the pharmacist: Not needed      [facility]     Shipping address confirmed in Epic.     Delivery Scheduled: Yes, Expected medication delivery date: 01/27.  However, Rx request for refills was sent to the provider as there are none remaining.     Medication will be delivered via Next Day Courier to the prescription address in Epic WAM.    Kevin [name]   [facility] Specialty Technician

## 2021-12-13 NOTE — Unmapped (Signed)
I have called and left patient a message stating need to call and set up follow up appt with [name]. Clinic call back number left also on voicemail.    Refill request received for patient.       Medication Requested: Kevin [name]  Last Office Visit: 05/04/21  Next Office Visit: no appt set- supposed to follow up 5-6 mos after last appt.    Per Provider note:  his current regimen:??Biktarvy (BIC/FTC/TAF)    Will forward refill request to provider

## 2021-12-15 NOTE — Unmapped (Signed)
Kevin [name] 's Biktarky shipment will be delayed as a result of the medication is too soon to refill until 1/29.     I have reached out to the patient  at [phone] and communicated the delivery change. We will reschedule the medication for the delivery date that the patient agreed upon.  We have confirmed the delivery date as 1/30, via same day courier.

## 2022-01-06 DIAGNOSIS — B20 Human immunodeficiency virus [HIV] disease: Principal | ICD-10-CM

## 2022-01-06 MED ORDER — BIKTARVY 50 MG-200 MG-25 MG TABLET
ORAL_TABLET | Freq: Every day | ORAL | 0 refills | 30 days
Start: 2022-01-06 — End: ?

## 2022-01-09 MED ORDER — BIKTARVY 50 MG-200 MG-25 MG TABLET
ORAL_TABLET | Freq: Every day | ORAL | 0 refills | 30 days | Status: CP
Start: 2022-01-09 — End: ?
  Filled 2022-01-17: qty 30, 30d supply, fill #0

## 2022-01-09 NOTE — Unmapped (Signed)
Refill request received for patient.  Pharmacy      Medication Requested:  Last Office Visit: 04/2021  Next Office Visit: has no upcoming office visit  Per Provider note:    Per protocol:   Rx send to Janyth Pupa for refills.

## 2022-01-09 NOTE — Unmapped (Signed)
LM and sent text to schedule appointment with Lahoma Rocker.

## 2022-01-11 NOTE — Unmapped (Signed)
The [facility] has made a second and final attempt to reach this patient to refill the following medication: Biktarvy.      We have left voicemails on the following phone numbers:  [phone] and [phone] and have sent a MyChart message. And sent a text    Dates contacted: 2/17, 2/22  Last scheduled delivery: 1/30    The patient may be at risk of non-compliance with this medication. The patient should call the [facility] at [phone]  Option 4, then Option 2 (all other specialty patients) to refill medication.    Westley Gambles   [facility] Specialty Technician

## 2022-01-12 NOTE — Unmapped (Signed)
[facility] Refill Coordination Note    Specialty Medication(s) to be Shipped:   Infectious Disease: Biktarvy    Other medication(s) to be shipped: No additional medications requested for fill at this time     Kevin [name], DOB: [birth year]  Phone: [phone] (home)       All above HIPAA information was verified with patient's caregiver, Study, lee (Father)      Was a Nurse, learning disability used for this call? No    Completed refill call assessment today to schedule patient's medication shipment from the [facility] [phone]).  All relevant notes have been reviewed.     Specialty medication(s) and dose(s) confirmed: Regimen is correct and unchanged.   Changes to medications: Syler reports no changes at this time.  Changes to insurance: No  New side effects reported not previously addressed with a pharmacist or physician: None reported  Questions for the pharmacist: No    Confirmed patient received a Conservation officer, historic buildings and a Surveyor, mining with first shipment. The patient will receive a drug information handout for each medication shipped and additional FDA Medication Guides as required.       DISEASE/MEDICATION-SPECIFIC INFORMATION        N/A    SPECIALTY MEDICATION ADHERENCE     Medication Adherence    Patient reported X missed doses in the last month: 0  Specialty Medication: BIKTARVY  Patient is on additional specialty medications: No           Biktarvy   8 days worth of medication on hand.      Were doses missed due to medication being on hold? No        REFERRAL TO PHARMACIST     Referral to the pharmacist: Not needed      [facility]     Shipping address confirmed in Epic.     Delivery Scheduled: Yes, Expected medication delivery date: 01/16/22.     Medication will be delivered via Same Day Courier to the prescription address in Epic WAM.    Kevin [name]   [facility] Specialty Technician

## 2022-01-16 NOTE — Unmapped (Signed)
Kevin [name] 's Biktarvy shipment will be delayed as a result of the medication is too soon to refill until 2/28.     I have reached out to the patient  at [phone] and communicated the delivery change. We will reschedule the medication for the delivery date that the patient agreed upon.  We have confirmed the delivery date as 2/28, via same day courier.

## 2022-02-03 DIAGNOSIS — B20 Human immunodeficiency virus [HIV] disease: Principal | ICD-10-CM

## 2022-02-03 MED ORDER — BIKTARVY 50 MG-200 MG-25 MG TABLET
ORAL_TABLET | Freq: Every day | ORAL | 0 refills | 30 days | Status: CP
Start: 2022-02-03 — End: ?
  Filled 2022-02-15: qty 30, 30d supply, fill #0

## 2022-02-03 NOTE — Unmapped (Signed)
LM and sent text to schedule next appointment with Lahoma Rocker.

## 2022-02-03 NOTE — Unmapped (Signed)
[facility] Refill Coordination Note    Specialty Medication(s) to be Shipped:   Infectious Disease: Biktarvy    Other medication(s) to be shipped: No additional medications requested for fill at this time     Kevin [name], DOB: May 08, 1991  Phone: [phone] (home)       All above HIPAA information was verified with patient's family member, Father - [name].     Was a Nurse, learning disability used for this call? No    Completed refill call assessment today to schedule patient's medication shipment from the [facility] [phone]).  All relevant notes have been reviewed.     Specialty medication(s) and dose(s) confirmed: Regimen is correct and unchanged.   Changes to medications: Sabre reports no changes at this time.  Changes to insurance: No  New side effects reported not previously addressed with a pharmacist or physician: None reported  Questions for the pharmacist: No    Confirmed patient received a Conservation officer, historic buildings and a Surveyor, mining with first shipment. The patient will receive a drug information handout for each medication shipped and additional FDA Medication Guides as required.       DISEASE/MEDICATION-SPECIFIC INFORMATION        N/A    SPECIALTY MEDICATION ADHERENCE     Medication Adherence    Patient reported X missed doses in the last month: 0  Specialty Medication: BIKTARVY 50-200-25 mg  Patient is on additional specialty medications: No              Were doses missed due to medication being on hold? No    Biktarvy 50-200-25 mg: 13 days of medicine on hand       REFERRAL TO PHARMACIST     Referral to the pharmacist: Not needed      [facility]     Shipping address confirmed in Epic.     Delivery Scheduled: Yes, Expected medication delivery date: 02/09/22.  However, Rx request for refills was sent to the provider as there are none remaining.     Medication will be delivered via Next Day Courier to the prescription address in Epic WAM.    Unk Lightning   [facility] Specialty Technician

## 2022-02-03 NOTE — Unmapped (Signed)
Medication Requested: Kevin [name]    Last Office Visit: 05/04/2021  Next Office Visit: Visit date not found  Per Provider Note: HIV (dx'd??02/2019, nadir CD4??241??/ 20% in??02/2019) ??- chronic, stable  Overall??Kevin [name] is doing well, with his current regimen:??Biktarvy (BIC/FTC/TAF)  Misses doses of ARVs rarely (1-2x/month).    Standing order protocol requirements met?: No    Sent to: Provider for signing    Days Supply Given: 0  Number of Refills: 0

## 2022-02-09 NOTE — Unmapped (Signed)
Kevin [name] 's Biktarvy shipment will be delayed as a result of the medication is too soon to refill until 3/29.     I have reached out to the patient father at [phone] and communicated the delivery change. We will reschedule the medication for the delivery date that the patient agreed upon.  We have confirmed the delivery date as 3/29, via same day courier.

## 2022-03-09 DIAGNOSIS — B20 Human immunodeficiency virus [HIV] disease: Principal | ICD-10-CM

## 2022-03-09 MED ORDER — BIKTARVY 50 MG-200 MG-25 MG TABLET
ORAL_TABLET | Freq: Every day | ORAL | 0 refills | 30 days
Start: 2022-03-09 — End: ?

## 2022-03-09 NOTE — Unmapped (Signed)
Patient is considered to be out of care.     Patient's last ID clinic appt: 05/04/2021    Patient's HIV Viral Load History: 05/04/2021 (40 copies)    Linkage and Retention Coordinator Assistant attempted to call patient for patient's re-engagement in care. Left a discreet 'appointment needed' message in voicemail box.    Linkage and Retention Coordinator Assistant sent text message to patient to re-engage in care via Artera texting app.    Linkage and Hydrologist sent MyChart message to patient to re-engage in care.    Linkage and Hydrologist drafted and sent an out of care letter in efforts to get the patient back into care.    ID Clinic Out of Care Patient - Bridge Counseling Care Plan     Goal:  Patient will have an office visit with provider visit within 6 months of last office visit unless stated otherwise by provider.     Interventions may include:  ?? Send patient an out of care notification via letter and/or MyChart  ?? Sending patient an out of care text message  ?? Placing phone calls to patient and/or listed contacts   ?? Contacting pharmacies as needed for additional information  ?? Researching other online resources as needed     If patient is reached, the bridge counselor will attempt to:  ?? Discover the reason for missed appointments, no shows, or lack of scheduled follow-up appointment such as transportation, finances, child care, or competing needs  ?? Make appropriate referrals and link patient to clinic nurses, social workers, Artist or other support services in clinic to address concerns or barriers to care  ?? Send In [employer] to social workers or nurses if case management is needed  ?? Schedule a follow-up appointment at appropriate interval based on assessment  ?? Monitor attendance at the appointment and provide reminder calls if needed  ?? Continue follow-up as indicated     Expected Outcome:  Patient will have an office visit with provider visit within 6 months of last office visit unless stated otherwise by provider.     **If the patient is not able to be located or retained in HIV care a referral will be placed to the [facility] for follow-up.    Duration of Service: 10 minutes    Lauren Gardiner Barefoot  Linkage and Hydrologist  [facility]

## 2022-03-09 NOTE — Unmapped (Addendum)
[facility] Clinical Assessment & Refill Coordination Note    Kevin [name], DOB: 23-Apr-1991  Phone: [phone] (home)     All above HIPAA information was verified with patient's family member, Tarius Stangelo (father).     Was a Nurse, learning disability used for this call? No    Specialty Medication(s):   Infectious Disease: Biktarvy     Current Outpatient Medications   Medication Sig Dispense Refill    bictegrav-emtricit-tenofov ala (BIKTARVY) 50-200-25 mg tablet Take 1 tablet by mouth daily. 30 tablet 0     No current facility-administered medications for this visit.        Changes to medications:  no changes    No Known Allergies    Changes to allergies: No    SPECIALTY MEDICATION ADHERENCE     Biktarvy 50-200-25 mg: 7 to 10 days of medicine on hand       Medication Adherence    Patient reported X missed doses in the last month: 0  Specialty Medication: Biktarvy 50-200-25mg   Patient is on additional specialty medications: No  Any gaps in refill history greater than 2 weeks in the last 3 months: no  Demonstrates understanding of importance of adherence: yes  Informant: father  Reliability of informant: reliable  Provider-estimated medication adherence level: good  Patient is at risk for Non-Adherence: No          Specialty medication(s) dose(s) confirmed: Regimen is correct and unchanged.     Are there any concerns with adherence? No    Adherence counseling provided? Not needed    CLINICAL MANAGEMENT AND INTERVENTION      Clinical Benefit Assessment:    Do you feel the medicine is effective or helping your condition? Yes    HIV ASSOCIATED LABS:     Lab Results   Component Value Date/Time    HIVRS Not Detected 05/04/2021 10:54 AM    HIVRS Not Detected 11/04/2020 10:35 AM    HIVRS Not Detected 02/26/2020 10:39 AM    HIVCP 86 (H) 12/23/2019 02:11 PM    HIVCP 34,583 (H) 03/12/2019 04:46 PM    ACD4 360 (L) 05/04/2021 10:54 AM    ACD4 528 11/04/2020 10:36 AM    ACD4 546 12/23/2019 02:11 PM Clinical Benefit counseling provided? Labs from 05/04/21 show evidence of clinical benefit    Adverse Effects Assessment:    Are you experiencing any side effects? No    Are you experiencing difficulty administering your medicine? No    Quality of Life Assessment:      How many days over the past month did your HIV  keep you from your normal activities? For example, brushing your teeth or getting up in the morning. 0    Have you discussed this with your provider? Not needed    Acute Infection Status:    Acute infections noted within Epic:  No active infections  Patient reported infection: None    Therapy Appropriateness:    Is therapy appropriate and patient progressing towards therapeutic goals? Yes, therapy is appropriate and should be continued    DISEASE/MEDICATION-SPECIFIC INFORMATION      N/A    PATIENT SPECIFIC NEEDS     Does the patient have any physical, cognitive, or cultural barriers? No    Is the patient high risk? No    Does the patient require a Care Management Plan? No         SHIPPING     Specialty Medication(s) to be Shipped:   Infectious  Disease: Biktarvy    Other medication(s) to be shipped: No additional medications requested for [name] at this time     Changes to insurance: No    Delivery Scheduled: Yes, Expected medication delivery date: 03/15/22.  However, Rx request for refills was sent to the provider as there are none remaining.     Medication will be delivered via Same Day Courier to the confirmed prescription address in [facility].    The patient will receive a drug information handout for each medication shipped and additional FDA Medication Guides as required.  Verified that patient has previously received a Conservation officer, historic buildings and a Surveyor, mining.    The patient or caregiver noted above participated in the development of this care plan and knows that they can request review of or adjustments to the care plan at any time.      All of the patient's questions and concerns have been addressed.    Roderic Palau   [facility] Specialty Pharmacist

## 2022-03-10 MED ORDER — BIKTARVY 50 MG-200 MG-25 MG TABLET
ORAL_TABLET | Freq: Every day | ORAL | 0 refills | 30.00000 days | Status: CP
Start: 2022-03-10 — End: ?
  Filled 2022-03-15: qty 30, 30d supply, fill #0

## 2022-03-10 NOTE — Unmapped (Deleted)
Update on Biktarvy shipment:     Mr. Vue shipment of Susanne Borders has been cancelled due to his refill request being denied due to being overdue for an appointment at the [facility].  I called and spoke to his father, Undra Harriman, to notify him that Trinna Post must make an appointment to be seen and that his shipment of Biktarvy that was going to be delivered on 03/15/22 has been cancelled due to no active prescription.  His father said he will touch base with Trinna Post today.    Corliss Skains. [place], [place].D.  Specialty Pharmacist  [facility]  [phone] option 4 then option 2

## 2022-03-10 NOTE — Unmapped (Signed)
Addended byMicheline Maze, Orren Pietsch on: 03/10/2022 12:05 PM     Modules accepted: Orders

## 2022-03-11 NOTE — Unmapped (Deleted)
Update on Biktarvy shipment:     Mr. Motl's shipment of Biktarvy has been cancelled due to his refill request being denied due to being overdue for an appointment at the [facility].  I called and spoke to his father, [name] Obando, to notify him that Alex must make an appointment to be seen and that his shipment of Biktarvy that was going to be delivered on 03/15/22 has been cancelled due to no active prescription.  His father said he will touch base with Alex today.    Makaylee Spielberg M. Kimmarie Pascale, Pharm.D.  Specialty Pharmacist  [facility]  [phone] option 4 then option 2

## 2022-03-27 ENCOUNTER — Ambulatory Visit: Admit: 2022-03-27 | Discharge: 2022-03-28 | Payer: PRIVATE HEALTH INSURANCE

## 2022-03-27 DIAGNOSIS — R768 Other specified abnormal immunological findings in serum: Principal | ICD-10-CM

## 2022-03-27 DIAGNOSIS — Z113 Encounter for screening for infections with a predominantly sexual mode of transmission: Principal | ICD-10-CM

## 2022-03-27 DIAGNOSIS — B20 Human immunodeficiency virus [HIV] disease: Principal | ICD-10-CM

## 2022-03-27 LAB — CBC W/ AUTO DIFF
BASOPHILS ABSOLUTE COUNT: 0 10*9/L (ref 0.0–0.1)
BASOPHILS RELATIVE PERCENT: 0.6 %
EOSINOPHILS ABSOLUTE COUNT: 0.1 10*9/L (ref 0.0–0.5)
EOSINOPHILS RELATIVE PERCENT: 0.9 %
HEMATOCRIT: 44.1 % (ref 39.0–48.0)
HEMOGLOBIN: 15.1 g/dL (ref 12.9–16.5)
LYMPHOCYTES ABSOLUTE COUNT: 1.9 10*9/L (ref 1.1–3.6)
LYMPHOCYTES RELATIVE PERCENT: 24.8 %
MEAN CORPUSCULAR HEMOGLOBIN CONC: 34.2 g/dL (ref 32.0–36.0)
MEAN CORPUSCULAR HEMOGLOBIN: 30.6 pg (ref 25.9–32.4)
MEAN CORPUSCULAR VOLUME: 89.5 fL (ref 77.6–95.7)
MEAN PLATELET VOLUME: 9.1 fL (ref 6.8–10.7)
MONOCYTES ABSOLUTE COUNT: 0.6 10*9/L (ref 0.3–0.8)
MONOCYTES RELATIVE PERCENT: 8 %
NEUTROPHILS ABSOLUTE COUNT: 5 10*9/L (ref 1.8–7.8)
NEUTROPHILS RELATIVE PERCENT: 65.7 %
NUCLEATED RED BLOOD CELLS: 0 /100{WBCs} (ref <=4)
PLATELET COUNT: 231 10*9/L (ref 150–450)
RED BLOOD CELL COUNT: 4.92 10*12/L (ref 4.26–5.60)
RED CELL DISTRIBUTION WIDTH: 12.6 % (ref 12.2–15.2)
WBC ADJUSTED: 7.6 10*9/L (ref 3.6–11.2)

## 2022-03-27 LAB — AST: AST (SGOT): 24 U/L (ref <=34)

## 2022-03-27 LAB — BASIC METABOLIC PANEL
ANION GAP: 7 mmol/L (ref 5–14)
BLOOD UREA NITROGEN: 15 mg/dL (ref 9–23)
BUN / CREAT RATIO: 18
CALCIUM: 9.4 mg/dL (ref 8.7–10.4)
CHLORIDE: 108 mmol/L — ABNORMAL HIGH (ref 98–107)
CO2: 23.3 mmol/L (ref 20.0–31.0)
CREATININE: 0.85 mg/dL
EGFR CKD-EPI (2021) MALE: >90 mL/min/{1.73_m2} (ref >=60)
GLUCOSE RANDOM: 93 mg/dL (ref 70–179)
POTASSIUM: 3.8 mmol/L (ref 3.4–4.8)
SODIUM: 138 mmol/L (ref 135–145)

## 2022-03-27 LAB — BILIRUBIN, TOTAL: BILIRUBIN TOTAL: 0.5 mg/dL (ref 0.3–1.2)

## 2022-03-27 LAB — ALT: ALT (SGPT): 36 U/L (ref 10–49)

## 2022-03-27 MED ORDER — BIKTARVY 50 MG-200 MG-25 MG TABLET
ORAL_TABLET | Freq: Every day | ORAL | 5 refills | 30 days | Status: CP
Start: 2022-03-27 — End: ?
  Filled 2022-04-14: qty 30, 30d supply, fill #0

## 2022-03-27 NOTE — Unmapped (Signed)
INFECTIOUS DISEASES CLINIC  [address]  [place], [place]  [address]  P [phone]  F [phone]     Primary care provider: Saher Davee Nonda Lou, [place]    Assessment/Plan:      HIV (dx'd 02/2019, nadir CD4 241 / 20% in 02/2019)  - chronic, stable   Overall doing well. Current regimen: Biktarvy (BIC/FTC/TAF)  Misses doses of ARVs rarely    Med access through insurance  CD4 count over 300 for >2Y on suppressive ART; no prophylaxis needed; recheck CD4 annually  Discussed ARV adherence, injectable ARVs, spacing cationic compounds from INIs, and U=U (tx as prevention)   - Not interested in LAI ART at this time.    Lab Results   Component Value Date    ACD4 360 (L) 05/04/2021    CD4 36 05/04/2021    HIVRS Not Detected 05/04/2021    HIVCP 86 (H) 12/23/2019       Nicotine dependence  -   - Currently vaping (former smoker). A cartridge will last him 1-2 weeks.  - Not interested in cutting down at this time.  Revisit readiness at next appt.      Sexual health & secondary prevention      Sexually active with men. Not currently sexually active, but no STI testing since 04/2021. He requests 3 point testing and will update RPR.     Lab Results   Component Value Date    RPR Nonreactive 05/04/2021    RPR Nonreactive 11/04/2020    CTNAA Negative 05/04/2021    CTNAA Negative 05/04/2021    CTNAA Negative 05/04/2021    GCNAA Negative 05/04/2021    GCNAA Negative 05/04/2021    GCNAA Negative 05/04/2021    SPECSOURCE Urine 05/04/2021    SPECSOURCE Throat 05/04/2021    SPECSOURCE Rectum 05/04/2021       Health maintenance  -     Oral health  He does not have a dentist. Last dental exam >1 year ago.  - He plans to follow-up with local dentist Claris Gower) once he gets dental insurance.     Metabolic conditions  Wt Readings from Last 5 Encounters:   03/27/22 82.1 kg (181 lb)   05/04/21 80.1 kg (176 lb 9.6 oz)   11/04/20 82.2 kg (181 lb 3.2 oz)   03/22/20 78.9 kg (173 lb 14.4 oz)   02/26/20 78 kg (172 lb)     Lab Results   Component Value Date    CREATININE 0.76 05/04/2021    GLUCOSEU Negative 03/12/2019    GLU 90 05/04/2021    A1C 5.0 03/12/2019    ALT 25 05/04/2021    ALT 33 11/04/2020    ALT 39 12/23/2019     # Kidney health - creatinine today  # Bone health - assessment not yet needed (under age 87)  # Diabetes assessment - random glucose today  # NAFLD assessment - suspicion for NAFLD low    Communicable diseases  Lab Results   Component Value Date    QFTTBGOLD Negative 03/12/2019    HEPAIGG Nonreactive 03/12/2019    HEPBSAB Nonreactive 03/12/2019    HEPCAB Reactive (A) 03/12/2019    HCVRNA Not Detected 05/04/2021     # TB screening - no longer needed; negative IGRA, low risk  # Hepatitis screening - Pt declines Hep A/B vaccine. We discussed risks of transmission and ways to reduce risk.   # MMR screening - not assessed    Cancer screening  Lab Results  Component Value Date    FINALDX  04/06/2020     A: Soft tissue, left neck, excision  - Epidermal cyst, pilar type    This electronic signature is attestation that the pathologist personally reviewed the submitted material(s) and the final diagnosis reflects that evaluation.           Immunization History   Administered Date(s) Administered    COVID-19 VACCINE,MRNA(MODERNA)(PF) 08/03/2020       Immunizations today -  He declines all vaccines today, including Covid, Pneumonia, Hepatitis A, Hepatitis B, HPV, Tetanus.     Disposition  Next appointment: 5-6 months    Lahoma Rocker, PA-C  [facility]   [address]  [place], [place].  [address]  Phone: [phone]   Fax: [phone]      Subjective      Chief Complaint   HIV    HPI  In addition to details in A&P above:    - Mr. Frisk is here for HIV follow-up. He was last seen 11 months ago. Since then he reports he's been doing well. No recent illness, ER visits, hospitalizations. He takes Biktarvy in the morning. He misses 1-2 doses per month. He doesn't take any other medications, herbs, supplements, vitamins.  - He lives in [place]. 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   - He has not been sexually active recently, but has been since our last visit. No longer with his previous partner..  - No fever, rash, chills, cough, dyspnea, nausea, vomiting, abdominal pain, rectal pain, dysuria, throat pain.     Past Medical History:   Diagnosis Date    HIV disease (CMS-HCC)     Syphilis      Social History    Housing - in house in [place].  School / Work - He is currently in Therapist, art.Marland Kitchen He works at a [employer].     Tobacco - He reports that he has been smoking e-cigarettes.      Substance use - previous cocaine    Review of Systems  As per HPI. All others negative.      Medications and Allergies  He has a current medication list which includes the following prescription(s): biktarvy.    Allergies: Patient has no known allergies.    Family History  His family history includes Alzheimer's disease in his maternal grandfather and paternal grandfather; Crohn's disease in his father; Diabetes in his maternal grandmother; Heart failure in his maternal grandmother; Lymphoma in his paternal grandmother.     Objective      BP 110/60 (BP Site: L Arm, BP Position: Sitting, BP Cuff Size: Medium)  - Pulse 90  - Temp 36.8 ??C (98.2 ??F) (Temporal)  - Ht 170.2 cm (5' 7)  - Wt 82.1 kg (181 lb)  - BMI 28.35 kg/m??      Physical Exam  Vitals reviewed.   Constitutional:       Appearance: Normal appearance.   HENT:      Mouth/Throat:      Mouth: Mucous membranes are moist.      Pharynx: Oropharynx is clear.   Eyes:      Conjunctiva/sclera: Conjunctivae normal.      Pupils: Pupils are equal, round, and reactive to light.   Cardiovascular:      Rate and Rhythm: Normal rate and regular rhythm.   Pulmonary:      Effort: Pulmonary effort is normal.  Breath sounds: Normal breath sounds.   Lymphadenopathy:      Head:      Right side of head: No submental, submandibular, tonsillar, preauricular, posterior auricular or occipital adenopathy.      Left side of head: No submental, submandibular, tonsillar, preauricular, posterior auricular or occipital adenopathy.   Neurological:      Mental Status: He is alert.   Psychiatric:         Mood and Affect: Mood normal.         Behavior: Behavior normal.         Thought Content: Thought content normal.         Judgment: Judgment normal.

## 2022-03-27 NOTE — Unmapped (Signed)
Attempted to meet with patient regarding RW, but patient left before benefits could see them.    Mickle Asper,  Benefits & Eligibility Coordinator  Time of Intervention: 5 minutes

## 2022-03-28 LAB — HIV RNA, QUANTITATIVE, PCR
HIV RNA QNT RSLT: DETECTED — AB
HIV RNA: <20 {copies}/mL — ABNORMAL HIGH (ref <0)

## 2022-03-28 LAB — LYMPH MARKER LIMITED,FLOW
ABSOLUTE CD3 CNT: 1444 {cells}/uL (ref 915–3400)
ABSOLUTE CD4 CNT: 684 {cells}/uL (ref 510–2320)
ABSOLUTE CD8 CNT: 684 {cells}/uL (ref 180–1520)
CD3% (T CELLS): 76 % (ref 61–86)
CD4% (T HELPER): 36 % (ref 34–58)
CD4:CD8 RATIO: 1 (ref 0.9–4.8)
CD8% T SUPPRESR: 36 % (ref 12–38)

## 2022-03-28 LAB — HEPATITIS C RNA, QUANTITATIVE, PCR: HCV RNA: NOT DETECTED

## 2022-03-28 LAB — SYPHILIS SCREEN: SYPHILIS RPR SCREEN: NONREACTIVE

## 2022-03-29 LAB — HEPATITIS B SURFACE ANTIGEN: HEPATITIS B SURFACE ANTIGEN: NONREACTIVE

## 2022-03-29 LAB — HEPATITIS B SURFACE ANTIBODY
HEPATITIS B SURFACE ANTIBODY QUANT: <8 m[IU]/mL (ref <8.00)
HEPATITIS B SURFACE ANTIBODY: NONREACTIVE

## 2022-04-11 NOTE — Unmapped (Signed)
[facility] Refill Coordination Note    Specialty Medication(s) to be Shipped:   Infectious Disease: Biktarvy    Other medication(s) to be shipped: No additional medications requested for fill at this time     Kevin [name], DOB: 1991/09/17  Phone: [phone] (home)       All above HIPAA information was verified with patient's family member, dad.     Was a Nurse, learning disability used for this call? No    Completed refill call assessment today to schedule patient's medication shipment from the [facility] [phone]).  All relevant notes have been reviewed.     Specialty medication(s) and dose(s) confirmed: Regimen is correct and unchanged.   Changes to medications: Kevin [name] reports no changes at this time.  Changes to insurance: No  New side effects reported not previously addressed with a pharmacist or physician: None reported  Questions for the pharmacist: No    Confirmed patient received a Conservation officer, historic buildings and a Surveyor, mining with first shipment. The patient will receive a drug information handout for each medication shipped and additional FDA Medication Guides as required.       DISEASE/MEDICATION-SPECIFIC INFORMATION        N/A    SPECIALTY MEDICATION ADHERENCE     Medication Adherence    Patient reported X missed doses in the last month: 0  Specialty Medication: biktarvy              Were doses missed due to medication being on hold? No    Unable to confirm quantity on hand    REFERRAL TO PHARMACIST     Referral to the pharmacist: Not needed      [facility]     Shipping address confirmed in Epic.     Delivery Scheduled: Yes, Expected medication delivery date: 5/26.     Medication will be delivered via Same Day Courier to the prescription address in Epic WAM.    Westley Gambles   [facility] Specialty Technician

## 2022-05-05 NOTE — Unmapped (Signed)
[facility] Refill Coordination Note    Specialty Medication(s) to be Shipped:   Infectious Disease: Biktarvy    Other medication(s) to be shipped: No additional medications requested for fill at this time     Kevin [name], DOB: 04/16/1991  Phone: [phone] (home)       All above HIPAA information was verified with patient's family member, dad.     Was a Nurse, learning disability used for this call? No    Completed refill call assessment today to schedule patient's medication shipment from the [facility] [phone]).  All relevant notes have been reviewed.     Specialty medication(s) and dose(s) confirmed: Regimen is correct and unchanged.   Changes to medications: [name] reports no changes at this time.  Changes to insurance: No  New side effects reported not previously addressed with a pharmacist or physician: None reported  Questions for the pharmacist: No    Confirmed patient received a Conservation officer, historic buildings and a Surveyor, mining with first shipment. The patient will receive a drug information handout for each medication shipped and additional FDA Medication Guides as required.       DISEASE/MEDICATION-SPECIFIC INFORMATION        N/A    SPECIALTY MEDICATION ADHERENCE     Medication Adherence    Patient reported X missed doses in the last month: 0  Specialty Medication: Biktarvy  Patient is on additional specialty medications: No  Any gaps in refill history greater than 2 weeks in the last 3 months: no  Demonstrates understanding of importance of adherence: yes  Informant: father  Reliability of informant: reliable  Confirmed plan for next specialty medication refill: delivery by pharmacy  Refills needed for supportive medications: not needed              Were doses missed due to medication being on hold? No    Unable to confirm quantity on hand    REFERRAL TO PHARMACIST     Referral to the pharmacist: Not needed      [facility]     Shipping address confirmed in Epic. Delivery Scheduled: Yes, Expected medication delivery date: 05/12/2022.     Medication will be delivered via Next Day Courier to the prescription address in Epic WAM.    [name]   [facility] Specialty Technician

## 2022-05-11 MED FILL — BIKTARVY 50 MG-200 MG-25 MG TABLET: ORAL | 30 days supply | Qty: 30 | Fill #1

## 2022-06-06 ENCOUNTER — Ambulatory Visit: Admit: 2022-06-06 | Discharge: 2022-06-07 | Payer: PRIVATE HEALTH INSURANCE

## 2022-06-06 DIAGNOSIS — R222 Localized swelling, mass and lump, trunk: Principal | ICD-10-CM

## 2022-06-06 MED ORDER — DOXYCYCLINE MONOHYDRATE 100 MG CAPSULE
ORAL_CAPSULE | Freq: Two times a day (BID) | ORAL | 0 refills | 7 days | Status: CP
Start: 2022-06-06 — End: 2022-06-13

## 2022-06-06 NOTE — Unmapped (Addendum)
It was nice to see you today.    Please take Doxycyline 1 pill twice a day for the next 7 days- this may help with the inflammation of the area on your buttock.    For dermatology recommendations, there are a few below.    [facility]      [address]     [place]          [phone]         [email or web address]  --------------------------------------------------------------------------------------------------------------------------------------------------------------------------------------------    [facility]        [address]         [email or web address]                                                                           [address]                   [place]        [phone]  --------------------------------------------------------------------------------------------------------------------------------------------------------------------------------------------    [facility]     [address]                               [place]ony Cargo          [phone]         [email or web address]    -------------------------------------------------------------------------------------------------------------------------------------------------------------------------------------------    [facility])    [address]    [phone]         [email or web address]    ----------------------------------------------------------------------------------------------------------------------------------------------------------------------------------------------    [facility]                 [address]        [place]               [phone]         [email or web address]                                                                        [address]                                       [place]          [phone]  [address] [facility] [address]       [phone]                                                                         [address]                [place]             [phone]    -----------------------------------------------------------------------------------------------------------------------------------------------------------------------------------------------    [facility]   [address]     [phone]           [email or web address]    _____________________________________________________________________________________________________________________       [facility]                            [address]                          [place]            [email or web address]    ----------------------------------------------------------------------------------------------------------------------------------------------------------------------------------------------------    [facility]    [phone] [facility] [address]       [place]            [phone]             [email or web address]    --------------------------------------------------------------------------------------------------------------------------------------------------------------------------------------------------    [facility]  [address]    [place]  [phone]       [email or web address]    --------------------------------------------------------------------------------------------------------------------------------------------------------------------------------------------------    [facility] [address]           [phone]          [email or web address]                                                                                                                                                                               [phone]

## 2022-06-06 NOTE — Unmapped (Signed)
Assessment/Plan:      Kevin [name], a 31 y.o. male seen today for urgent evaluation of painful nodule of buttock    Plan:    Nodule of buttock  - Yearlong history of nodule of buttock with intermittent drainage and pain. He reports seeing Dermatologist who tried steroid injections and then excision but that the nodule recently returned. It is only minimally painful but has some purulent drainage.  - Concern for infected inclusion cyst vs developing abscess vs anal fistula (based on location and recurrence).  - Start Doxycyline 100mg  BID x 7 days  - Referral placed to [facility] for additional evaluation.   -     doxycycline (MONODOX) 100 MG capsule; Take 1 capsule (100 mg total) by mouth Two (2) times a day for 7 days.  -     Ambulatory referral to Dermatology; Future    HIV  Well controlled on Biktarvy  Fills ART via private insurance.   Lab Results   Component Value Date    ACD4 684 03/27/2022    HIVCP <20 (H) 03/27/2022    HIVRS Detected (A) 03/27/2022     ?? Continue current therapy.  ?? No labs today.  ?? Encouraged continued excellent ARV adherence.      I personally spent 25 minutes face-to-face and non-face-to-face in the care of this patient, which includes all pre, intra, and post visit time on the date of service.  All documented time was specific to the E/M visit and does not include any procedures that may have been performed.    Disposition  Return to clinic 5-6 months or sooner if needed.    Lahoma Rocker, PA-C  [facility]   [address]  [place], [place].  [address]  Phone: [phone]   Fax: [phone]     Subjective:      Chief Complaint   HIV followup    HPI  Urgent visit for Kevin [name], a 31 y.o. male presenting for rectal nodule with pain and discharge.    [name] reports noticing a recurrent nodule near the lower aspect of his buttocks around June 2022. It would have occasional drainage and painful, but then would resolve on it's own. He saw Saw Dermatologist in [place] Dr. Russella DarCheree Ditto. He had steroid injections but these weren't working so he had excision 3 months ago. It was better for a bit but then returned a few weeks ago. He will have some bleeding and discharge and it's uncomfortable to sit on, although otherwise not very tender.     Past Medical History:   Diagnosis Date   ??? HIV disease (CMS-HCC)    ??? Syphilis      Medications and Allergies   Reviewed and updated today. See bottom of this visit's encounter summary for details.  Current Outpatient Medications on File Prior to Visit   Medication Sig   ??? bictegrav-emtricit-tenofov ala (BIKTARVY) 50-200-25 mg tablet Take 1 tablet by mouth daily.     No current facility-administered medications on file prior to visit.       No Known Allergies    Social History  Social History     Tobacco Use   ??? Smoking status: Every Day     Packs/day: 0.25     Types: e-Cigarettes, Cigarettes   ??? Smokeless tobacco: Never   ??? Tobacco comments:     social, vaping   Substance Use Topics   ??? Alcohol use: Yes     Alcohol/week:  75.0 standard drinks     Types: 75 Shots of liquor per week       Review of Systems  As per HPI. Remainder of 10 systems reviewed, negative.        Objective:      BP 142/87 (BP Site: L Arm, BP Position: Sitting, BP Cuff Size: Medium)  - Pulse 94  - Temp 36.9 ??C (98.4 ??F) (Temporal)  - Ht 170.2 cm (5' 7)  - Wt 81.2 kg (179 lb)  - BMI 28.04 kg/m??     Physical Exam  Vitals reviewed.   Constitutional:       Appearance: Normal appearance.   Pulmonary:      Effort: Pulmonary effort is normal.   Skin:     Comments: 1-2cm erythematous nodule with small amount of purulent discharge expressed. Slightly tender. Overall firm but without any areas of fluctuance. No palpable cord.    Neurological:      Mental Status: He is alert.         Laboratory Data  Reviewed in Epic today, using Synopsis and Chart Review filters.    Lab Results   Component Value Date    CREATININE 0.85 03/27/2022    QFTTBGOLD Negative 03/12/2019    HEPCAB Reactive (A) 03/12/2019    HCVRNA Not Detected 03/27/2022    CHOL 169 03/12/2019    HDL 26 (L) 03/12/2019    LDL 123 (H) 03/12/2019    NONHDL 143 03/12/2019    TRIG 100 03/12/2019    A1C 5.0 03/12/2019    FINALDX  04/06/2020     A: Soft tissue, left neck, excision  - Epidermal cyst, pilar type    This electronic signature is attestation that the pathologist personally reviewed the submitted material(s) and the final diagnosis reflects that evaluation.

## 2022-06-09 NOTE — Unmapped (Signed)
[facility] Refill Coordination Note    Specialty Medication(s) to be Shipped:   Infectious Disease: Biktarvy    Other medication(s) to be shipped: No additional medications requested for fill at this time     Kevin [name], DOB: 14-Nov-1991  Phone: [phone] (home)       All above HIPAA information was verified with patient's family member, Father - Kevin [name].     Was a Nurse, learning disability used for this call? No    Completed refill call assessment today to schedule patient's medication shipment from the [name] [phone]).  All relevant notes have been reviewed.     Specialty medication(s) and dose(s) confirmed: Regimen is correct and unchanged.   Changes to medications: Kevin [name] reports no changes at this time.  Changes to insurance: No  New side effects reported not previously addressed with a pharmacist or physician: None reported  Questions for the pharmacist: No    Confirmed patient received a Conservation officer, historic buildings and a Surveyor, mining with first shipment. The patient will receive a drug information handout for each medication shipped and additional FDA Medication Guides as required.       DISEASE/MEDICATION-SPECIFIC INFORMATION        N/A    SPECIALTY MEDICATION ADHERENCE     Medication Adherence    Patient reported X missed doses in the last month: 0  Specialty Medication: biktarvy  Patient is on additional specialty medications: No  Patient is on more than two specialty medications: No  Any gaps in refill history greater than 2 weeks in the last 3 months: no  Demonstrates understanding of importance of adherence: yes              Were doses missed due to medication being on hold? No    Biktarvy 50-200-25 mg: 7 days of medicine on hand       REFERRAL TO PHARMACIST     Referral to the pharmacist: Not needed      Kevin [name]     Shipping address confirmed in Epic.     Delivery Scheduled: Yes, Expected medication delivery date: 06/13/22 .     Medication will be delivered via Next Day Courier to the prescription address in Epic WAM.    Kevin [name]   [facility] Specialty Technician

## 2022-06-09 NOTE — Unmapped (Unsigned)
The [facility] has made a second and final attempt to reach this patient to refill the following medication: Biktarvy.      We have left voicemails on the following phone numbers: [phone], have sent a MyChart message and have sent a Mychart questionnaire..    Dates contacted: 7/13, 7/20  Last scheduled delivery: shipped 6/22    The patient may be at risk of non-compliance with this medication. The patient should call the [facility] at [phone]  Option 4, then Option 2 (all other specialty patients) to refill medication.    Westley Gambles   [facility] Specialty Technician

## 2022-06-12 MED FILL — BIKTARVY 50 MG-200 MG-25 MG TABLET: ORAL | 30 days supply | Qty: 30 | Fill #2

## 2022-07-07 NOTE — Unmapped (Signed)
[facility] Refill Coordination Note    Specialty Medication(s) to be Shipped:   Infectious Disease: Biktarvy    Other medication(s) to be shipped: No additional medications requested for fill at this time     Kevin [name], DOB: 11/06/1991  Phone: [phone] (home)       All above HIPAA information was verified with patient.     Was a Nurse, learning disability used for this call? No    Completed refill call assessment today to schedule patient's medication shipment from the [facility] [phone]).  All relevant notes have been reviewed.     Specialty medication(s) and dose(s) confirmed: Regimen is correct and unchanged.   Changes to medications: Kevin [name] reports no changes at this time.  Changes to insurance: No  New side effects reported not previously addressed with a pharmacist or physician: None reported  Questions for the pharmacist: No    Confirmed patient received a Conservation officer, historic buildings and a Surveyor, mining with first shipment. The patient will receive a drug information handout for each medication shipped and additional FDA Medication Guides as required.       DISEASE/MEDICATION-SPECIFIC INFORMATION        N/A    SPECIALTY MEDICATION ADHERENCE     Medication Adherence    Patient reported X missed doses in the last month: 0  Specialty Medication: BIKTARVY  Patient is on additional specialty medications: No  Patient is on more than two specialty medications: No  Any gaps in refill history greater than 2 weeks in the last 3 months: no  Demonstrates understanding of importance of adherence: yes  Informant: patient  Reliability of informant: reliable  Provider-estimated medication adherence level: good  Patient is at risk for Non-Adherence: No  Reasons for non-adherence: no problems identified                  Confirmed plan for next specialty medication refill: delivery by pharmacy  Refills needed for supportive medications: not needed          Refill Coordination    Has the Patients' Contact Information Changed: No  Is the Shipping Address Different: No         Were doses missed due to medication being on hold? No          BIKTARVY 50-200-25 mg: 08/22 days of medicine on hand     REFERRAL TO PHARMACIST     Referral to the pharmacist: Not needed      [facility]     Shipping address confirmed in Epic.     Delivery Scheduled: Yes, Expected medication delivery date: 08/22.     Medication will be delivered via Next Day Courier to the prescription address in Epic WAM.    Kevin [name]   [facility] Specialty Technician

## 2022-07-10 MED FILL — BIKTARVY 50 MG-200 MG-25 MG TABLET: ORAL | 30 days supply | Qty: 30 | Fill #3

## 2022-07-17 NOTE — Unmapped (Signed)
Pt called for referral to another plastic surgeon who takes his insurance. I left message for him to call the dermatologist back. They were the ones referring him.

## 2022-08-01 NOTE — Unmapped (Signed)
[facility] Refill Coordination Note    Specialty Medication(s) to be Shipped:   Infectious Disease: Biktarvy    Other medication(s) to be shipped: No additional medications requested for fill at this time     Kevin [name], DOB: Nov 30, 1990  Phone: [phone] (home)       All above HIPAA information was verified with patient's family member, dad.     Was a Nurse, learning disability used for this call? No    Completed refill call assessment today to schedule patient's medication shipment from the [facility] [phone]).  All relevant notes have been reviewed.     Specialty medication(s) and dose(s) confirmed: Regimen is correct and unchanged.   Changes to medications: Kevin [name] reports no changes at this time.  Changes to insurance: No  New side effects reported not previously addressed with a pharmacist or physician: None reported  Questions for the pharmacist: No    Confirmed patient received a Conservation officer, historic buildings and a Surveyor, mining with first shipment. The patient will receive a drug information handout for each medication shipped and additional FDA Medication Guides as required.       DISEASE/MEDICATION-SPECIFIC INFORMATION        N/A    SPECIALTY MEDICATION ADHERENCE     Medication Adherence    Patient reported X missed doses in the last month: 0  Specialty Medication: biktarvy 50-200-25mg                           Were doses missed due to medication being on hold? No    biktarvy 50-200-25mg   : 7 days of medicine on hand       REFERRAL TO PHARMACIST     Referral to the pharmacist: Not needed      [facility]     Shipping address confirmed in Epic.     Delivery Scheduled: Yes, Expected medication delivery date: 9/19.     Medication will be delivered via Next Day Courier to the prescription address in Epic WAM.    Kevin [name]   [facility] Specialty Technician

## 2022-08-08 MED FILL — BIKTARVY 50 MG-200 MG-25 MG TABLET: ORAL | 30 days supply | Qty: 30 | Fill #4

## 2022-08-21 ENCOUNTER — Ambulatory Visit: Admit: 2022-08-21 | Discharge: 2022-08-22 | Payer: PRIVATE HEALTH INSURANCE | Attending: Surgery | Primary: Surgery

## 2022-08-21 NOTE — Unmapped (Signed)
Patient is here today for eval of a nodule on his buttock. Area has been present for the past 8-9 months. He saw a dermatologist in [place] who injected the area with steroids X 2 and then excised the area. Path showed cyst. Patient notes that he feels the area returned pretty quickly after removal. His PCP said it could be a fistula. The area becomes inflamed off and on. Does not currently feel inflamed.

## 2022-08-21 NOTE — Unmapped (Signed)
Patient Name: Kevin [name]  Medical Record Number: [id number]  Date of Service: 08/21/22    Referring Provider: Wynona Neat, PA.    Primary Provider: Wynona Neat, PA.    Chief Complaint: This is a 31 y.o. male who is referred in consultation for right buttock chronically recurreing draining wound.    History of Present Illness:  Kevin [name] is 31 y.o.  male who presents with an approximately 8-10 month history of right buttock chronically recurreing draining wound.  He had a steroid injection at the dermatologist followed by an excision.  He had recurrence so was referred.  His father has Crohn's disease, mostly in remission.  The patient has not had problems with Crohn's disease.    Past Medical History:  Past Medical History:   Diagnosis Date    HIV disease (CMS-HCC)     Syphilis      Patient Active Problem List    Diagnosis Date Noted    Nodule of buttock 05/04/2021    Hepatitis C antibody positive in blood 05/04/2021    Screen for STD (sexually transmitted disease) 05/04/2021    Left cervical lymphadenopathy 03/22/2020    Syphilis 04/16/2019    HIV disease (CMS-HCC) 03/12/2019       Past Surgical History:   Past Surgical History:   Procedure Laterality Date    DENTAL SURGERY      PR EXC TUMOR SOFT TISSUE NECK/ANT THORAX SUBFASCIAL <5CM Left 04/06/2020    Procedure: EXCISION TUMOR, SOFT TISSUE OF NECK OR THORAX; DEEP, SUBFASCIAL (EG, INTRAMUSCULAR); LESS THAN 5 CM;  Surgeon: Derry Lory, MD;  Location: MAIN OR [facility];  Service: ENT       Medications:   Current Outpatient Medications on File Prior to Visit   Medication Sig Dispense Refill    bictegrav-emtricit-tenofov ala (BIKTARVY) 50-200-25 mg tablet Take 1 tablet by mouth daily. 30 tablet 5     No current facility-administered medications on file prior to visit.         Allergies:  No Known Allergies  He reports nausea with Doxycycline    Social History:  Social History     Socioeconomic History    Marital status: Single   Tobacco Use    Smoking status: Every Day     Packs/day: .25     Types: e-Cigarettes, Cigarettes    Smokeless tobacco: Never    Tobacco comments:     social, vaping   Vaping Use    Vaping Use: Every day   Substance and Sexual Activity    Alcohol use: Yes     Alcohol/week: 75.0 standard drinks of alcohol     Types: 75 Shots of liquor per week    Drug use: Yes     Frequency: 7.0 times per week     Types: Cocaine, Marijuana     Comment: xanax and cocaine occasionally    Sexual activity: Yes     Partners: Male     Birth control/protection: Condom     Social Determinants of Health     Physical Activity: Sufficiently Active (03/12/2019)    Exercise Vital Sign     Days of Exercise per Week: 3 days     Minutes of Exercise per Session: 60 min   Stress: Stress Concern Present (03/12/2019)    [employer] of Occupational Health - Occupational Stress Questionnaire     Feeling of Stress : Rather much   Social Connections: Unknown (03/12/2019)    Social Connection and  Isolation Panel [NHANES]     Frequency of Communication with Friends and Family: More than three times a week     Frequency of Social Gatherings with Friends and Family: More than three times a week     Attends Religious Services: Never     Database administrator or Organizations: No     Attends Engineer, structural: Never   He lives in [place] with his father.  He works as a Social worker.  He smokes marijuana and he vapes.  He drinks on Saturdays.    Family History:   Family History   Problem Relation Age of Onset    Crohn's disease Father     Diabetes Maternal Grandmother     Heart failure Maternal Grandmother     Alzheimer's disease Maternal Grandfather     Lymphoma Paternal Grandmother     Alzheimer's disease Paternal Grandfather          Physical examination:     BP 145/82 (BP Site: L Arm, BP Position: Sitting, BP Cuff Size: Large)  - Pulse 91  - Temp 36.6 ??C (97.8 ??F) (Temporal)  - Ht 170.2 cm (5' 7.01)  - Wt 81.6 kg (180 lb)  - SpO2 98%  - BMI 28.19 kg/m??     CONSTITUTIONAL: This is a well-appearing male in no acute distress, whose appearance is consistent with stated age.   EYES: Anicteric. Extraocular movements are grossly intact.   EAR, NOSE, MOUTH AND THROAT: Mucous membranes moist.  Trachea midline.   HEART/CARDIOVASCULAR: Normal sinus rhythm.   CHEST/PULMONARY: Clear to auscultation. Normal work of breathing on room air.   ABDOMEN/GASTROINTESTINAL: Soft, nontender and nondistended without palpable organomegaly or mass.   ANORECTUM: There is right buttock chronic wound (7 o'clock patient supine) and palpable tract consistent with superficial fistula  SKIN: Warm and well perfused without cyanosis or edema.   NEUROLOGIC: Alert and oriented x3. Sensory and motor grossly intact.  LYMPHATICS: No palpable lymphadenopathy.        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     Plan:   We will proceed to the operating room for exam under anesthesia and treatment of fistula as indicated.     The risks, benefits and alternatives to treatment were discussed including risks associated with anesthesia, the risk of bleeding, the risk of infection, the risk of damage to other structures including sphincter muscles with resulting incontinence, and the possibility of unanticipated pathology that would require additional treatment. It was explained that the operation is typically done through a small incision versus seton placement. With all of the patient's questions answered, and understanding the risks, the patient wished to proceed.       Berton Mount, MD  08/21/2022  1:34 PM

## 2022-09-04 NOTE — Unmapped (Signed)
[facility] Refill Coordination Note    Specialty Medication(s) to be Shipped:   Infectious Disease: Biktarvy    Other medication(s) to be shipped: No additional medications requested for fill at this time     Kevin [name], DOB: 10-09-1991  Phone: [phone] (home)       All above HIPAA information was verified with patient.     Was a Nurse, learning disability used for this call? No    Completed refill call assessment today to schedule patient's medication shipment from the [facility] [phone]).  All relevant notes have been reviewed.     Specialty medication(s) and dose(s) confirmed: Regimen is correct and unchanged.   Changes to medications: Ashley reports no changes at this time.  Changes to insurance: No  New side effects reported not previously addressed with a pharmacist or physician: None reported  Questions for the pharmacist: No    Confirmed patient received a Conservation officer, historic buildings and a Surveyor, mining with first shipment. The patient will receive a drug information handout for each medication shipped and additional FDA Medication Guides as required.       DISEASE/MEDICATION-SPECIFIC INFORMATION        N/A    SPECIALTY MEDICATION ADHERENCE     Medication Adherence    Patient reported X missed doses in the last month: 0  Specialty Medication: Biktarvy 50-200-25mg   Patient is on additional specialty medications: No  Patient is on more than two specialty medications: No                          Were doses missed due to medication being on hold? No    Biktarvy 50-200-25 mg: 5-7 days of medicine on hand       REFERRAL TO PHARMACIST     Referral to the pharmacist: Not needed      [facility]     Shipping address confirmed in Epic.     Delivery Scheduled: Yes, Expected medication delivery date: 09/07/22.     Medication will be delivered via Next Day Courier to the prescription address in Epic WAM.    Nancy Nordmann [facility] Specialty Technician

## 2022-09-06 MED FILL — BIKTARVY 50 MG-200 MG-25 MG TABLET: ORAL | 30 days supply | Qty: 30 | Fill #5

## 2022-09-11 ENCOUNTER — Ambulatory Visit: Admit: 2022-09-11 | Discharge: 2022-09-11 | Payer: PRIVATE HEALTH INSURANCE

## 2022-09-11 ENCOUNTER — Encounter
Admit: 2022-09-11 | Discharge: 2022-09-11 | Payer: PRIVATE HEALTH INSURANCE | Attending: Certified Registered" | Primary: Certified Registered"

## 2022-09-11 DIAGNOSIS — K603 Anal fistula: Principal | ICD-10-CM

## 2022-09-11 MED ORDER — NIFED 0.3%/LIDOCA 1.5% W/PETRO
0 refills | 0 days | Status: CP
Start: 2022-09-11 — End: ?
  Filled 2022-09-11: qty 100, 30d supply, fill #0

## 2022-09-11 MED ORDER — OXYCODONE 5 MG TABLET
ORAL_TABLET | Freq: Three times a day (TID) | ORAL | 0 refills | 4 days | Status: CP | PRN
Start: 2022-09-11 — End: 2022-09-16
  Filled 2022-09-11: qty 10, 3d supply, fill #0

## 2022-09-11 MED ADMIN — pregabalin (LYRICA) capsule 100 mg: 100 mg | ORAL | @ 15:00:00 | Stop: 2022-09-11

## 2022-09-11 MED ADMIN — fentaNYL (PF) (SUBLIMAZE) injection: INTRAVENOUS | @ 17:00:00 | Stop: 2022-09-11

## 2022-09-11 MED ADMIN — propofoL (DIPRIVAN) injection: INTRAVENOUS | @ 16:00:00 | Stop: 2022-09-11

## 2022-09-11 MED ADMIN — bisacodyL enema 10 mg: 10 mg | RECTAL | @ 16:00:00 | Stop: 2022-09-11

## 2022-09-11 MED ADMIN — phenylephrine 0.8 mg/10 mL (80 mcg/mL) injection: INTRAVENOUS | @ 17:00:00 | Stop: 2022-09-11

## 2022-09-11 MED ADMIN — midazolam (VERSED) injection: INTRAVENOUS | @ 16:00:00 | Stop: 2022-09-11

## 2022-09-11 MED ADMIN — propofoL (DIPRIVAN) injection: INTRAVENOUS | @ 17:00:00 | Stop: 2022-09-11

## 2022-09-11 MED ADMIN — fentaNYL (PF) (SUBLIMAZE) injection: INTRAVENOUS | @ 16:00:00 | Stop: 2022-09-11

## 2022-09-11 MED ADMIN — lactated Ringers infusion: 10 mL/h | INTRAVENOUS | @ 15:00:00 | Stop: 2022-09-11

## 2022-09-11 MED ADMIN — ondansetron (ZOFRAN) injection: INTRAVENOUS | @ 16:00:00 | Stop: 2022-09-11

## 2022-09-11 MED ADMIN — acetaminophen (TYLENOL) tablet 1,000 mg: 1000 mg | ORAL | @ 15:00:00 | Stop: 2022-09-11

## 2022-09-11 MED ADMIN — bupivacaine (PF) (MARCAINE) 0.25 % (2.5 mg/mL) 30 mL, lidocaine (XYLOCAINE) 10 mg/mL (1 %) 30 mL: @ 17:00:00 | Stop: 2022-09-11

## 2022-09-11 MED ADMIN — dexAMETHasone (DECADRON) 4 mg/mL injection: INTRAVENOUS | @ 16:00:00 | Stop: 2022-09-11

## 2022-09-11 MED ADMIN — lidocaine (XYLOCAINE) 20 mg/mL (2 %) injection: INTRAVENOUS | @ 16:00:00 | Stop: 2022-09-11

## 2022-09-11 MED ADMIN — heparin (porcine) 5,000 unit/mL injection 5,000 Units: 5000 [IU] | SUBCUTANEOUS | @ 15:00:00 | Stop: 2022-09-11

## 2022-09-11 NOTE — Unmapped (Signed)
Exam Under Anesthesia with Fistulotomy and Seton Placement Pocedure Note      Pre-operative Diagnosis:    Anal fistula.    Post-operative Diagnosis:     Transsphincteric anal fistula    Procedure (s) Performed:      1.  Exam under anesthesia.  2.  Partial fistulotomy.  3.  Draining seton placement.    Teaching Surgeon:  D. Remigio Eisenmenger, MD.    Resident Surgeon:  Cleophus Molt, MD.     Assistant (s):  None.     Anesthesia:  General LMA anesthesia.    Anesthesiologist:  Jasmine Pang, DO    Specimens:    None.               Drains:  White Vessel loop.    Cultures:  None.    Estimated Blood Loss:  2 mL.    Total IV Fluids:  700 mL crystalloid.           Urine output:  Not recorded.         Complications:  None    Indications:  Kevin [name] has had problems with fistula in ano and desired treatment. The risks, benefits and alternatives to treatment were discussed with the patient and understanding these, the patient wished to proceed.     Operative Findings:  There was a fistula in ano, with the patient supine in the 10 o'clock position.  There appeared to be more than 30% of external sphincter involved.    Procedure Details:  The patient was brought from the holding area to the operating room and placed on the operating room table in the supine position, being careful to pad all pressure points and secure the patient safely to the table. Heparin, 5,000 units, was given subcutaneously for DVT prophylaxis. Sequential compression devices were placed for DVT prophylaxis.  Prior to induction of general anesthesia, a timeout was called to verify the patient's identification, the intended procedure and the patient's allergies. General anesthesia was induced. The patient was placed in the lithotomy position being careful to pad all pressure points and secure the patient safely to the table.   The perineum was prepped and draped in the usual sterile fashion. A second timeout was called to verify the patient's identification, the intended procedure and the patient's allergies.       The exam under anesthesia was performed.  Using a lacrimal duct probe from the dentate line and from the external opening of the anal fistula, the tract was able to be identified.  With a lacrimal duct probe traversed the entire length of the fistula, the skin and subcutaneous tissue externally was opened with electrocautery.  When muscle was encountered, the muscle was spared opening only anoderm distally and the most distal mucosa proximally.  It seemed that more than 30% of the external sphincter was involved suggesting a transsphincteric fistula.  The decision was made to leave the muscle intact and place a right vessel loop as a draining seton; the vessel loop was fixed in its loop configuration with a total of three 3-0 silk sutures.  The wound was dressed with antibiotic ointment, a sterile ABD pad, and mesh pants.    The patient was returned to the supine position, awakened, and taken from the operating room to the recovery area in stable condition.     Condition:  Good    Disposition: PACU - hemodynamically stable.    Post-op plan/Instructions: The patient should be discharged home today and follow up  in our colorectal surgery clinic.     Teaching Surgeon Attestation:  I, Kevin [name], M.D., was present for the entire procedure from preprocedure timeout to closure of the incisions.     Kevin Mount, MD  09/11/2022  1:36 PM

## 2022-09-13 NOTE — Unmapped (Signed)
Telephone call from pt requesting for a 1 week follow up from seton placement. I explained to him that we schedule these appointment at least 4 weeks post op in order to see healing and discuss next steps. I also explained to him why the appointment is that far out. Appt made

## 2022-09-18 ENCOUNTER — Ambulatory Visit: Admit: 2022-09-18 | Payer: PRIVATE HEALTH INSURANCE

## 2022-09-19 ENCOUNTER — Ambulatory Visit: Admit: 2022-09-19 | Discharge: 2022-09-20 | Payer: PRIVATE HEALTH INSURANCE | Attending: Surgery | Primary: Surgery

## 2022-09-19 DIAGNOSIS — K603 Anal fistula: Principal | ICD-10-CM

## 2022-09-19 NOTE — Unmapped (Signed)
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  He currently has a seton in place.  In terms of treatment options, considering the complex nature of his fistula he will likely need either cutting seton or repair.  That said depending on the operative findings, fistulotomy also be an option.  I reviewed the risk and benefits of surgery with the patient in detail.  Plans for exam under anesthesia with treatment of complex anal fistula.            History of Present Illness:    Chief Complaint: Anal fistula    31 year old Kevin [name] who I am seeing today in consultation at the request of Dr. Winnifred Friar for a complex anal fistula.  The patient underwent recent exam under anesthesia with seton placement for complex fistula.  He is doing much better since placement of the seton.  He is concerned about what the next steps in treatment might look like.  He is here today to discuss treatment of a complex anal fistula and likely removal of seton.      Allergies    Doxycycline      Medications      Current Outpatient Medications   Medication Sig Dispense Refill    bictegrav-emtricit-tenofov ala (BIKTARVY) 50-200-25 mg tablet Take 1 tablet by mouth daily. 30 tablet 5    NIFEdipine 0.3% lidocaine 1.5% in petrolatum ointment Apply a small, pea size amount to affected area up to three times a day as needed for pain 100 g 0     No current facility-administered medications for this visit.         Past Medical History    Past Medical History:   Diagnosis Date    HIV disease (CMS-HCC)     Syphilis          Past Surgical History    Past Surgical History:   Procedure Laterality Date    DENTAL SURGERY      PR EXC TUMOR SOFT TISSUE NECK/ANT THORAX SUBFASCIAL <5CM Left 04/06/2020    Procedure: EXCISION TUMOR, SOFT TISSUE OF NECK OR THORAX; DEEP, SUBFASCIAL (EG, INTRAMUSCULAR); LESS THAN 5 CM;  Surgeon: Kevin Lory, MD;  Location: MAIN OR [facility];  Service: ENT    PR REMOVAL ANAL FISTULA,TRANS/SUPRA/EXTRAPHINCT, +/-SETON N/A 10/Kevin/2023    Procedure: SURGICAL TX ANAL FISTULA (FISTULECT/FISTULOT); TRANS/SUPRA/EXTRASPHINCTERIC, W/PLACEMENT SETON IF PERFORMED;  Surgeon: Kevin Branch, MD;  Location: [facility] OR [facility];  Service: General Surgery    PR SURG DIAGNOSTIC EXAM, ANORECTAL N/A 10/Kevin/2023    Procedure: ANORECTAL EXAM, SURGICAL, REQUIRING ANESTHESIA (GENERAL, SPINAL, OR EPIDURAL), DIAGNOSTIC;  Surgeon: Kevin Branch, MD;  Location: [facility] OR [facility];  Service: General Surgery         Family History    Family History   Problem Relation Age of Onset    Crohn's disease Father     Diabetes Maternal Grandmother     Heart failure Maternal Grandmother     Alzheimer's disease Maternal Grandfather     Lymphoma Paternal Grandmother     Alzheimer's disease Paternal Grandfather          Social History:    Social History     Socioeconomic History    Marital status: Single     Spouse name: None    Number of children: None    Years of education: None    Highest education level: None   Tobacco  Use    Smoking status: Every Day     Packs/day: .25     Types: e-Cigarettes, Cigarettes    Smokeless tobacco: Never    Tobacco comments:     social, vaping   Vaping Use    Vaping Use: Every day   Substance and Sexual Activity    Alcohol use: Yes     Alcohol/week: 10.0 standard drinks of alcohol     Types: 10 Shots of liquor per week    Drug use: Yes     Frequency: 7.0 times per week     Types: Cocaine, Marijuana     Comment: no current cocaine; smokes weed daily    Sexual activity: Yes     Partners: Kevin [name]     Birth control/protection: Condom     Social Determinants of Health     Physical Activity: Sufficiently Active (03/12/2019)    Exercise Vital Sign     Days of Exercise per Week: 3 days     Minutes of Exercise per Session: 60 min   Stress: Stress Concern Present (03/12/2019)    [employer] of Occupational Health - Occupational Stress Questionnaire     Feeling of Stress : Rather much   Social Connections: Unknown (03/12/2019)    Social Connection and Isolation Panel [NHANES]     Frequency of Communication with Friends and Family: More than three times a week     Frequency of Social Gatherings with Friends and Family: More than three times a week     Attends Religious Services: Never     Database administrator or Organizations: No     Attends Engineer, structural: Never         Review of Systems    Review of Systems   Constitutional:  Negative for chills and diaphoresis.   Respiratory:  Negative for choking and chest tightness.    Cardiovascular:  Negative for palpitations and leg swelling.   Gastrointestinal:  Negative for anal bleeding, blood in stool and rectal pain.   Endocrine: Negative for cold intolerance, heat intolerance and polydipsia.   Genitourinary:  Negative for dysuria and flank pain.   Musculoskeletal:  Negative for back pain and neck pain.   Skin:  Negative for color change and rash.   Neurological:  Negative for dizziness, light-headedness and headaches.   Hematological:  Negative for adenopathy. Does not bruise/bleed easily.           Vital Signs    BP 134/69  - Pulse 77  - Ht 170.2 cm (5' 7)  - Wt 80.7 kg (178 lb)  - BMI 27.88 kg/m??   Body mass index is 27.88 kg/m??.      Physical Exam    General Appearance:  No acute distress, well appearing and well nourished.   Head:  Normocephalic, atraumatic.   Eyes:  Conjuctiva and lids appear normal. Pupils equal and round,   sclera anicteric.   Ears:  Overall appearance normal with no scars, lesions or                masses.  Hearing is grossly normal.   Nose: Nares grossly normal, no drainage.   Throat: Lips, mucosa, and tongue normal; teeth and gums normal.   Neck: Supple, symmetrical, trachea midline, no adenopathy.   Pulmonary:    Normal respiratory effort.No audible wheezes.   Cardiovascular:  Regular rate and rhythm.  No thrill noted.   Abdomen:   Soft, non-tender, without masses.  No hepatosplenomegaly.  No hernias appreciated.   Rectal:    Musculoskeletal: Complex anal fistula with seton in place    Normal gait.  Extremities without clubbing, cyanosis, or           edema.   Skin: Skin color, texture, turgor normal, no rashes or lesions.   Neurologic: No motor abnormalities noted.  Sensation grossly intact.   Psychiatric: Judgement and insight appropriate.  Oriented to person,          place,  and time.           Test Results    All lab results last 24 hours:  No results found for this or any previous visit (from the past 24 hour(s)).    Imaging: None      Problem List    Patient Active Problem List   Diagnosis    HIV disease (CMS-HCC)    Syphilis    Left cervical lymphadenopathy    Nodule of buttock    Hepatitis C antibody positive in blood    Screen for STD (sexually transmitted disease)    Anal fistula

## 2022-09-20 NOTE — Unmapped (Signed)
09/20/2022- patient called back after having left a message that he is bleeding the seton that he had is gone.  Stated that Dr Peri Jefferson and team will be informed of the missing seton and that meanwhile - sitz baths, wearing a pad to protect his clothing and hydration are the keys.  Stated understanding and know that he is to be scheduled for surgery in the next couple of weeks.  Provided nurse name and direct phone number for contact info or questions/concerns.

## 2022-09-21 DIAGNOSIS — K603 Anal fistula: Principal | ICD-10-CM

## 2022-09-25 NOTE — Unmapped (Signed)
Patient needs to reschedule missed appointment with [name]

## 2022-09-26 ENCOUNTER — Encounter
Admit: 2022-09-26 | Discharge: 2022-09-26 | Payer: PRIVATE HEALTH INSURANCE | Attending: Anesthesiology | Primary: Anesthesiology

## 2022-09-26 ENCOUNTER — Ambulatory Visit: Admit: 2022-09-26 | Discharge: 2022-09-26 | Payer: PRIVATE HEALTH INSURANCE

## 2022-09-26 MED ORDER — AMOXICILLIN 875 MG-POTASSIUM CLAVULANATE 125 MG TABLET
ORAL_TABLET | Freq: Two times a day (BID) | ORAL | 0 refills | 10 days | Status: CP
Start: 2022-09-26 — End: 2022-10-06
  Filled 2022-09-26: qty 20, 10d supply, fill #0

## 2022-09-26 MED ORDER — OXYCODONE 10 MG TABLET
ORAL_TABLET | ORAL | 0 refills | 2 days | Status: CP | PRN
Start: 2022-09-26 — End: 2022-10-01
  Filled 2022-09-26: qty 10, 2d supply, fill #0

## 2022-09-26 MED ADMIN — propofol (DIPRIVAN) injection: INTRAVENOUS | @ 21:00:00

## 2022-09-26 MED ADMIN — ondansetron (ZOFRAN) injection: INTRAVENOUS | @ 21:00:00

## 2022-09-26 MED ADMIN — ketamine (KETALAR) injection: INTRAVENOUS | @ 21:00:00

## 2022-09-26 MED ADMIN — lactated Ringers infusion: 40 mL/h | INTRAVENOUS | @ 19:00:00 | Stop: 2022-09-26

## 2022-09-26 MED ADMIN — fentaNYL (PF) (SUBLIMAZE) injection: INTRAVENOUS | @ 21:00:00

## 2022-09-26 MED ADMIN — lidocaine (PF) (XYLOCAINE) injection: INTRAVENOUS | @ 21:00:00

## 2022-09-26 MED ADMIN — lactated Ringers infusion: 40 mL/h | INTRAVENOUS | @ 21:00:00 | Stop: 2022-09-26

## 2022-09-26 MED ADMIN — dexmedeTOMIDine (Precedex) (4 mcg/mL) infusion: INTRAVENOUS | @ 21:00:00

## 2022-09-26 MED ADMIN — bupivacaine-EPINEPHrine (PF) (MARCAINE-PF w/EPI) 0.5 %-1:200,000 30 mL, bupivacaine LIPOSOMAL (PF) (EXPAREL) 20 mL: @ 21:00:00 | Stop: 2022-09-26

## 2022-09-26 MED ADMIN — midazolam (VERSED) injection: INTRAVENOUS | @ 21:00:00

## 2022-09-26 MED ADMIN — dexAMETHasone (DECADRON) 4 mg/mL injection: INTRAVENOUS | @ 21:00:00

## 2022-09-27 NOTE — Unmapped (Signed)
Anesthesia Side Effects:                 Today we gave you an anesthetic or a sedative.     We require you have a responsible adult with you the rest of the day and overnight, for your safety.     You should NOT do the following for the next 24 hours or at any time while taking prescription pain medicine:  Do not drink alcohol  Do not sign legal documents  Do not drive a car or operate heavy machinery  Do not use power tools    You may have a very dry mouth and sore throat. Warm saltwater gargles, drinking cool or warm beverages may help to soothe your throat.     It is normal to feel tired and weak after receiving anesthesia. You should limit your activities today and rest at home.     After anesthesia, it is better to start with a light liquid diet such as soft drinks, tea, Jell-O or broth.  Next you can try soup and crackers.   Finally work up to solid foods. Please remember that it is very important for you to drink liquids during the next 24 hours.    The amount of pain you have may differ from what other people experience.  Your pain may be unpredictable. Call your surgeon if you cannot control your pain by using the pain medicine you were prescribed.    If the patient is a child, you should watch the child closely to prevent accidents. A child may be slower to react and his or her sense of balance may be off for the next 24 hours.

## 2022-09-27 NOTE — Unmapped (Signed)
Operative Note  (CSN: [id number])      Date of Surgery: 09/26/2022    Pre-op Diagnosis: ANAL FISTULA    Post-op Diagnosis: Same    Procedure(s):  1.  Rectal exam under anesthesia  2.  Removal of seton  3.  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  Is undergoing exam under anesthesia.  He currently has a seton in place.  He is being evaluated for either fistulotomy, or cutting seton placement.    Procedure after appropriate surgical timeout, we started procedure.  The seton that is currently in place was identified.  This is consistent with a complex fistula.  Local anesthetic was injected for pain control.  The seton was then removed and exchanged with 4 silk setons.  We then tied down one of the silk setons tightly to allow it to act as a cutting seton.  The other 3 silk setons were then secured in place with the intention of being tightened during subsequent office visits.  Then carefully evaluated for hemostasis.  He tolerated procedure well.  Surgeon attestation: I performed the operation.  Findings: Transfer enteric fistula    @cocpro @    Anesthesia: Monitor Anesthesia Care    Estimated Blood Loss: 10 mL    Complications: None    Specimens: None collected    Implants: * No implants in log *    Surgeon Notes: I was present and scrubbed for the entire procedure    Ree Kida, MD   Date: 09/26/2022  Time: 8:49 PM

## 2022-09-29 DIAGNOSIS — B20 Human immunodeficiency virus [HIV] disease: Principal | ICD-10-CM

## 2022-09-29 MED ORDER — BIKTARVY 50 MG-200 MG-25 MG TABLET
ORAL_TABLET | Freq: Every day | ORAL | 5 refills | 30 days | Status: CP
Start: 2022-09-29 — End: ?
  Filled 2022-11-10: qty 30, 30d supply, fill #0

## 2022-09-29 NOTE — Unmapped (Signed)
Medication Requested: Kevin [name]      Future Appointments   Date Time Provider Department Center   10/16/2022  1:00 PM Wynona Neat, [place] [facility]   10/24/2022  9:50 AM Ree Kida, MD [facility]     Per Provider Note: Well controlled on Biktarvy     Standing order protocol requirements met?: Yes    Sent to: Pharmacy per protocol    Days Supply Given: 30 days  Number of Refills: 0

## 2022-10-01 ENCOUNTER — Ambulatory Visit
Admit: 2022-10-01 | Discharge: 2022-10-01 | Disposition: A | Discharge status: Home / Self Care | Payer: PRIVATE HEALTH INSURANCE | Attending: Family

## 2022-10-01 DIAGNOSIS — L0291 Cutaneous abscess, unspecified: Principal | ICD-10-CM

## 2022-10-01 MED ADMIN — lidocaine-EPINEPHrine (XYLOCAINE W/EPI) 1 %-1:100,000 injection 10 mL: 10 mL | @ 17:00:00 | Stop: 2022-10-01

## 2022-10-01 NOTE — Unmapped (Signed)
Pt reports abscess under left side of his chin x 8-9 days.  2 antibiotics given, states not getting any better.  Area is a large abscess.

## 2022-10-01 NOTE — Unmapped (Signed)
Emergency Department Provider Note        ED Clinical Impression     Final diagnoses:   Abscess (Primary)       ED Assessment/Plan     Patient presents with a abscess on his face/jaw for the last 9 days.  Not responding to oral antibiotics.  Gradually worsening.  Discussed risks and benefits of I&D.  I&D performed.  Patient tolerated well.  Will discharge home.    History     Chief Complaint   Patient presents with    Abscess     HPI    Patient has had an abscess to his left anterior neck/lower jaw.  Is been present for the last 9 days.  It is gradually worsening despite oral antibiotics.  He has had an abscess in the same location in the past that seem to respond to antibiotics alone.  Notes that she needed systemic symptoms such as fever.    Past Medical History:   Diagnosis Date    Anal fistula     History of COVID-19 2021    History of syphilis     HIV disease (CMS-HCC)        Past Surgical History:   Procedure Laterality Date    DENTAL SURGERY      PR EXC TUMOR SOFT TISSUE NECK/ANT THORAX SUBFASCIAL <5CM Left 04/06/2020    Procedure: EXCISION TUMOR, SOFT TISSUE OF NECK OR THORAX; DEEP, SUBFASCIAL (EG, INTRAMUSCULAR); LESS THAN 5 CM;  Surgeon: Derry Lory, MD;  Location: MAIN OR [facility];  Service: ENT    PR REMOVAL ANAL FISTULA,TRANS/SUPRA/EXTRAPHINCT, +/-SETON N/A 09/11/2022    Procedure: SURGICAL TX ANAL FISTULA (FISTULECT/FISTULOT); TRANS/SUPRA/EXTRASPHINCTERIC, W/PLACEMENT SETON IF PERFORMED;  Surgeon: Colon Branch, MD;  Location: [facility] OR [facility];  Service: General Surgery    PR SURG DIAGNOSTIC EXAM, ANORECTAL N/A 09/11/2022    Procedure: ANORECTAL EXAM, SURGICAL, REQUIRING ANESTHESIA (GENERAL, SPINAL, OR EPIDURAL), DIAGNOSTIC;  Surgeon: Colon Branch, MD;  Location: [facility] OR [facility];  Service: General Surgery    PR SURG DIAGNOSTIC EXAM, ANORECTAL N/A 09/26/2022    Procedure: RECTAL EXAM WITH TREATMENT OF ANAL FISTULA, SETON PLACEMENT;  Surgeon: Ree Kida, MD; Location: OR [place];  Service: General Surgery       Family History   Problem Relation Age of Onset    Crohn's disease Father     Diabetes Maternal Grandmother     Heart failure Maternal Grandmother     Alzheimer's disease Maternal Grandfather     Lymphoma Paternal Grandmother     Alzheimer's disease Paternal Grandfather        Social History     Socioeconomic History    Marital status: Single   Tobacco Use    Smoking status: Former     Types: Cigarettes     Quit date: 2020     Years since quitting: 3.8    Smokeless tobacco: Never   Vaping Use    Vaping Use: Every day    Substances: Nicotine   Substance and Sexual Activity    Alcohol use: Yes     Alcohol/week: 8.0 standard drinks of alcohol     Types: 8 Shots of liquor per week     Comment: weekends    Drug use: Yes     Frequency: 7.0 times per week     Types: Marijuana     Comment: smokes weed daily    Sexual activity: Yes     Partners: Male     Birth  control/protection: Condom     Social Determinants of Health     Physical Activity: Sufficiently Active (03/12/2019)    Exercise Vital Sign     Days of Exercise per Week: 3 days     Minutes of Exercise per Session: 60 min   Stress: Stress Concern Present (03/12/2019)    [employer] of Occupational Health - Occupational Stress Questionnaire     Feeling of Stress : Rather much   Social Connections: Unknown (03/12/2019)    Social Connection and Isolation Panel [NHANES]     Frequency of Communication with Friends and Family: More than three times a week     Frequency of Social Gatherings with Friends and Family: More than three times a week     Attends Religious Services: Never     Database administrator or Organizations: No     Attends Banker Meetings: Never       Review of Systems   Constitutional:  Negative for fever.   Musculoskeletal:  Positive for myalgias.   All other systems reviewed and are negative.      Physical Exam     BP 177/76  - Pulse 114  - Temp 36.4 ??C (97.5 ??F) (Oral)  - Resp 22  - Wt 81.6 kg (180 lb)  - SpO2 100%  - BMI 28.19 kg/m??     Physical Exam  Vitals reviewed.   Constitutional:       General: He is not in acute distress.     Appearance: Normal appearance. He is well-developed.   HENT:      Head: Normocephalic.   Eyes:      General: No scleral icterus.  Pulmonary:      Effort: Pulmonary effort is normal. No respiratory distress.   Musculoskeletal:         General: Normal range of motion.      Cervical back: Normal range of motion and neck supple.   Skin:     General: Skin is warm and dry.      Comments: Large area of swelling, fluctuance, and induration in the beard on the left anterior lower jaw/anterior neck.  There is hair loss.   Neurological:      Mental Status: He is alert.      Motor: No abnormal muscle tone.      Coordination: Coordination normal.   Psychiatric:         Behavior: Behavior normal.         Procedures     Incision/Drainage    Date/Time: 10/01/2022 12:36 PM    Performed by: Nicholes Calamity, FNP  Authorized by: Nicholes Calamity, FNP    Consent:     Consent obtained:  Verbal    Consent given by:  Patient    Risks, benefits, and alternatives were discussed: yes      Risks discussed:  Bleeding, infection, damage to other organs and incomplete drainage    Alternatives discussed:  Referral  Universal protocol:     Procedure explained and questions answered to patient or proxy's satisfaction: yes      Patient identity confirmed:  Verbally with patient  Location:     Type:  Abscess    Location:  Head  Sedation:     Sedation type:  None  Anesthesia:     Anesthesia method:  Local infiltration    Local anesthetic:  Lidocaine 1% WITH epi  Procedure type:     Complexity:  Simple  Procedure details:  Ultrasound guidance: no      Incision types:  Single straight    Drainage:  Purulent and bloody    Drainage amount:  Copious  Post-procedure details:     Procedure completion:  [facility] [place] [facility]             Medical Decision Making          Nicholes Calamity, FNP  10/01/22 1237

## 2022-10-04 NOTE — Unmapped (Signed)
Anaerobic/ Aerobic Culture  Order: [id number]  Status: Preliminary result      Neck; Swab, Intraoperative Collection  Aerobic/Anaerobic Culture   <1+ Staphylococcus lugdunensis     Patient with a positive anaerobic/aerobic culture. Patient was started on Augmentin BID Po for 10 days on 09/26/2022. Waiting on final results.

## 2022-10-05 MED ORDER — SULFAMETHOXAZOLE 800 MG-TRIMETHOPRIM 160 MG TABLET
ORAL_TABLET | Freq: Two times a day (BID) | ORAL | 0 refills | 10 days | Status: CP
Start: 2022-10-05 — End: 2022-10-15

## 2022-10-06 NOTE — Unmapped (Addendum)
Anaerobic/ Aerobic Culture  Order: [id number]  Status: Final result      Neck; Swab, Intraoperative Collection  Aerobic/Anaerobic Culture   <1+ Staphylococcus lugdunensis    Patient with a positive anaerobic/aerobic culture. Patient was started on Augmentin BID Po for 10 days on 09/26/2022. EMAP messaged to verify coverage.     10/06/2022 0937: See Dr. Cammy Copa note. No further actions needed.

## 2022-10-06 NOTE — Unmapped (Signed)
EMAP Progress Note    I was notified that the culture from the abscess resulted in a bacteria not covered by the outpatient antibiotic prescribed to the patient.    I have spoken with the patient and prescribed a 10 day course of Bactrim.  He advised me the pharmacy he is requesting to pick it up from and is in agreement with the plan.          Aerobic/Anaerobic Culture <1+ Staphylococcus lugdunensis Abnormal          Specimen Source: Neck            Gram Stain Result 3+ Polymorphonuclear leukocytes      No organisms seen              Resulting Agency: [facility]     Susceptibility     Staphylococcus lugdunensis     MIC SUSCEPTIBILITY RESULT KIRBY BAUER     Clindamycin   Susceptible     Doxycycline   Susceptible     Erythromycin   Resistant     Fluoroquinolone   No Interpretation 1     Gentamicin   Susceptible 2     Nafcillin   Resistant 3     Trimethoprim + Sulfamethoxazole   Susceptible     Vancomycin 2 Susceptible                 1 Fluoroquinolones are not indicated for the treatment of staphylococcal infections, including MRSA.   2 Gentamicin is used only in combination with other active agents that test susceptible   3 Methicillin-resistant staphylococci are resistant to all currently available beta-lactam antibiotics EXCEPT ceftaroline.[facility] lab at [phone] to request ceftaroline susceptibility testing.

## 2022-10-23 ENCOUNTER — Ambulatory Visit: Admit: 2022-10-23 | Discharge: 2022-10-24 | Payer: PRIVATE HEALTH INSURANCE

## 2022-10-23 DIAGNOSIS — Z79899 Other long term (current) drug therapy: Principal | ICD-10-CM

## 2022-10-23 DIAGNOSIS — Z5181 Encounter for therapeutic drug level monitoring: Principal | ICD-10-CM

## 2022-10-23 DIAGNOSIS — R197 Diarrhea, unspecified: Principal | ICD-10-CM

## 2022-10-23 DIAGNOSIS — Z9189 Other specified personal risk factors, not elsewhere classified: Principal | ICD-10-CM

## 2022-10-23 DIAGNOSIS — Z113 Encounter for screening for infections with a predominantly sexual mode of transmission: Principal | ICD-10-CM

## 2022-10-23 DIAGNOSIS — B20 Human immunodeficiency virus [HIV] disease: Principal | ICD-10-CM

## 2022-10-23 LAB — CBC W/ AUTO DIFF
BASOPHILS ABSOLUTE COUNT: 0 10*9/L (ref 0.0–0.1)
BASOPHILS RELATIVE PERCENT: 0.5 %
EOSINOPHILS ABSOLUTE COUNT: 0.1 10*9/L (ref 0.0–0.5)
EOSINOPHILS RELATIVE PERCENT: 1.9 %
HEMATOCRIT: 42.4 % (ref 39.0–48.0)
HEMOGLOBIN: 14.6 g/dL (ref 12.9–16.5)
LYMPHOCYTES ABSOLUTE COUNT: 1.8 10*9/L (ref 1.1–3.6)
LYMPHOCYTES RELATIVE PERCENT: 40.4 %
MEAN CORPUSCULAR HEMOGLOBIN CONC: 34.4 g/dL (ref 32.0–36.0)
MEAN CORPUSCULAR HEMOGLOBIN: 31.1 pg (ref 25.9–32.4)
MEAN CORPUSCULAR VOLUME: 90.4 fL (ref 77.6–95.7)
MEAN PLATELET VOLUME: 9.6 fL (ref 6.8–10.7)
MONOCYTES ABSOLUTE COUNT: 0.5 10*9/L (ref 0.3–0.8)
MONOCYTES RELATIVE PERCENT: 10.9 %
NEUTROPHILS ABSOLUTE COUNT: 2.1 10*9/L (ref 1.8–7.8)
NEUTROPHILS RELATIVE PERCENT: 46.3 %
PLATELET COUNT: 178 10*9/L (ref 150–450)
RED BLOOD CELL COUNT: 4.69 10*12/L (ref 4.26–5.60)
RED CELL DISTRIBUTION WIDTH: 13 % (ref 12.2–15.2)
WBC ADJUSTED: 4.6 10*9/L (ref 3.6–11.2)

## 2022-10-23 LAB — BASIC METABOLIC PANEL
ANION GAP: 9 mmol/L (ref 5–14)
BLOOD UREA NITROGEN: 9 mg/dL (ref 9–23)
BUN / CREAT RATIO: 8
CALCIUM: 8.8 mg/dL (ref 8.7–10.4)
CHLORIDE: 107 mmol/L (ref 98–107)
CO2: 24.1 mmol/L (ref 20.0–31.0)
CREATININE: 1.11 mg/dL
EGFR CKD-EPI (2021) MALE: >90 mL/min/{1.73_m2} (ref >=60)
GLUCOSE RANDOM: 107 mg/dL (ref 70–179)
POTASSIUM: 3.5 mmol/L (ref 3.4–4.8)
SODIUM: 140 mmol/L (ref 135–145)

## 2022-10-23 LAB — ALT: ALT (SGPT): 40 U/L (ref 10–49)

## 2022-10-23 LAB — AST: AST (SGOT): 25 U/L (ref <=34)

## 2022-10-23 LAB — BILIRUBIN, TOTAL: BILIRUBIN TOTAL: 0.5 mg/dL (ref 0.3–1.2)

## 2022-10-23 NOTE — Unmapped (Signed)
[facility] CLIN[facility]  [address]  [place], [place]  [address]  P [phone]  F [phone]     Assessment/Plan:      HIV (dx'd 02/2019, nadir CD4 241 / 20% in 02/2019)  - chronic, stable   Overall doing well. Current regimen: Biktarvy (BIC/FTC/TAF)  Misses doses of ARVs rarely    CD4 count over 300 for >2Y on suppressive ART; no prophylaxis needed; recheck CD4 annually  Discussed ARV adherenceand U=U (tx as prevention)              - Not interested in LAI ART.    Lab Results   Component Value Date    Absolute CD4 684 03/27/2022    CD4% 36% 03/27/2022    HIV RNA <20 (H) 03/27/2022     HIV RNA and safety labs (brief return panel)  Continue current therapy  Encouraged continued excellent ARV adherence    Diarrhea, unspecified type  - 2 week history of liquid, non-bloody stools 4 times daily. He denies any associated fevers, nausea, vomiting, abdominal pain. He denies use of laxatives for over 3 weeks. He has recent antibiotic exposure (Augmentin, Clindamycin, Bactrim) in early November.  - Highest concern is for antibiotic related diarrhea vs C. Diff based on timing of symptoms and lack of associated symptoms. Symptoms don't seem to be improving despite stopping antibiotics >2 weeks ago.  - Will collect GIPP and C diff today.  -     GI Pathogen Panel; Future  -     C. Difficile Assay; Future    Facial Abscess  - Seen in the ER 11/12 due to abscess of left jaw that didn't respond to oral antibiotics. S/p I & D. Culture grew S . Lugdunesis (S: Clinda, Doxy, Bactrim).   - Today, area is healing well without any tenderness, erythema, discharge.  - History of EIC in left neck, so if symptoms return, would recommend Dermatology evaluation if excision is needed.     Sexual health & secondary prevention      Sexually active with men. Will update STI testing today.   Lab Results   Component Value Date    RPR Nonreactive 03/27/2022    RPR Nonreactive 05/04/2021    CTNAA Negative 03/27/2022    CTNAA Negative 03/27/2022    CTNAA Negative 03/27/2022    GCNAA Negative 03/27/2022    GCNAA Negative 03/27/2022    GCNAA Negative 03/27/2022    SPECSOURCE Urine (Male) 03/27/2022    SPECSOURCE Throat 03/27/2022    SPECSOURCE Rectum 03/27/2022       GC/CT NAATs - obtained today from all exposed anatomical site(s)  RPR - for screening obtained today      Health maintenance      Metabolic conditions  Wt Readings from Last 5 Encounters:   10/23/22 81.6 kg (180 lb)   10/01/22 81.6 kg (180 lb)   09/26/22 81 kg (178 lb 9.2 oz)   09/19/22 80.7 kg (178 lb)   09/11/22 80.5 kg (177 lb 8 oz)     Lab Results   Component Value Date    CREATININE 0.85 03/27/2022    GLUCOSEU Negative 03/12/2019    GLU 93 03/27/2022    A1C 5.0 03/12/2019    ALT 36 03/27/2022    ALT 25 05/04/2021    ALT 33 11/04/2020     # Kidney health - creatinine today  # Bone health - assessment not yet needed (under age 27)  # Diabetes assessment -  random glucose today  # NAFLD assessment - suspicion for NAFLD low    Communicable diseases  Lab Results   Component Value Date    QFTTBGOLD Negative 03/12/2019    HEPAIGG Nonreactive 03/12/2019    HEPBSAB Nonreactive 03/27/2022    HEPCAB Reactive (A) 03/12/2019    HCVRNA Not Detected 03/27/2022     # TB screening - no longer needed; negative IGRA, low risk  # Hepatitis screening - repeat HCV screen periodically    Cancer screening  Lab Results   Component Value Date    FINALDX  04/06/2020     A: Soft tissue, left neck, excision  - Epidermal cyst, pilar type    This electronic signature is attestation that the pathologist personally reviewed the submitted material(s) and the final diagnosis reflects that evaluation.       Cardiovascular disease  Lab Results   Component Value Date    CHOL 169 03/12/2019    HDL 26 (L) 03/12/2019    LDL 123 (H) 03/12/2019    NONHDL 143 03/12/2019    TRIG 100 03/12/2019       Immunization History   Administered Date(s) Administered    COVID-19 VACCINE,MRNA(MODERNA)(PF) 08/03/2020       Immunizations needed - patient declines recommended immunization(s)      I personally spent 45 minutes face-to-face and non-face-to-face in the care of this patient, which includes all pre, intra, and post visit time on the date of service.    Disposition  Next appointment: 5-6 months    Lahoma Rocker, PA-C  [facility]   [address]  [place], [place].  [address]  Phone: [phone]   Fax: [phone]      Subjective        HPI  In addition to details in A&P above:  ***      Past Medical History:   Diagnosis Date    Anal fistula     History of COVID-19 2021    History of syphilis     HIV disease (CMS-HCC)          Social History  Background - ***    Housing - {ID Clinic - Housing (Optional):[address]} {ID Clinic - Lives with (Optional):[address]756}  School / Work & Benefits - {ID Clinic - School/Work/Benefits (Optional):77757}    Tobacco - {ID Clinic - Tobacco Use (Optional):77747}  Alcohol - {ID Clinic - Alcohol Use (Optional):77748}  Substance use - {IDSubstance Use- previous (list), active (list) :60796}      Medications and Allergies  He has a current medication list which includes the following prescription(s): biktarvy and NIFEdipine 0.3% lidocaine 1.5% in petrolatum ointment.    Allergies: Doxycycline      Family History  His family history includes Alzheimer's disease in his maternal grandfather and paternal grandfather; Crohn's disease in his father; Diabetes in his maternal grandmother; Heart failure in his maternal grandmother; Lymphoma in his paternal grandmother.           Objective      BP 117/82 (BP Site: L Arm, BP Position: Sitting, BP Cuff Size: Medium)  - Pulse 105  - Ht 170.2 cm (5' 7)  - Wt 81.6 kg (180 lb)  - BMI 28.19 kg/m??  ***    Const WDWN, NAD, non-toxic appearance ***   Eyes lids normal bilaterally, conjunctiva anicteric and noninjected OU ***  PERRL ***   ENMT normal appearance of external nose and ears, no nasal discharge ***  OP clear ***  Neck neck of normal appearance and trachea midline ***  no thyromegaly, nodules, or tenderness ***   Lymph no LAD in neck ***   CV RRR, no m/r/g, S1/S2 ***  no peripheral edema, WWP ***   Resp normal WOB ***  on RA, no breathlessness with speaking, no coughing, CTAB ***   GI normal inspection, NTND, NABS ***  no umbilical hernia on exam ***   GU deferred   MSK no clubbing or cyanosis of hands ***  no focal tenderness or abnormalities of joints of RUE, LUE, RLE, or LLE ***   Skin no rashes, lesions, or ulcers of visualized skin ***  no nodules or areas of induration of palpated skin ***   Neuro CNs II-XII grossly intact ***  sensation to light touch grossly intact throughout ***   Psych appropriate affect ***  oriented to person, place, time *** grandmother; Lymphoma in his paternal grandmother.           Objective      BP 117/82 (BP Site: L Arm, BP Position: Sitting, BP Cuff Size: Medium)  - Pulse 105  - Ht 170.2 cm (5' 7)  - Wt 81.6 kg (180 lb)  - BMI 28.19 kg/m??      Physical Exam  Vitals reviewed.   Constitutional:       Appearance: Normal appearance.   HENT:      Mouth/Throat:      Mouth: Mucous membranes are moist.      Pharynx: Oropharynx is clear.   Neck:     Cardiovascular:      Rate and Rhythm: Normal rate and regular rhythm.   Pulmonary:      Effort: Pulmonary effort is normal.      Breath sounds: Normal breath sounds.   Neurological:      Mental Status: He is alert.   Psychiatric:         Mood and Affect: Mood normal.         Behavior: Behavior normal.         Thought Content: Thought content normal.         Judgment: Judgment normal.

## 2022-10-24 ENCOUNTER — Ambulatory Visit: Admit: 2022-10-24 | Discharge: 2022-10-25 | Payer: PRIVATE HEALTH INSURANCE | Attending: Surgery | Primary: Surgery

## 2022-10-24 LAB — HIV RNA, QUANTITATIVE, PCR: HIV RNA QNT RSLT: NOT DETECTED

## 2022-10-24 LAB — SYPHILIS SCREEN: SYPHILIS RPR SCREEN: NONREACTIVE

## 2022-10-24 NOTE — Unmapped (Addendum)
Name: Kevin [name]  Date: 10/23/2022  Address: [address]  [place] [address]   [place] of Residence:  [facility]  Phone: [phone]     Started assessment with patient options: in clinic     Is this the same address for mailing? Yes  If No, Mailing Address is:     Building surveyor - Individual     Tax Filing Status  I did not file taxes     Employment Status  Employed Full Time    Income  Salary/Wages    If no or low income, how are you meeting your basic needs?  Not Applicable    List Tax Household Members including relationship to you:   N/A    Someone in my household receives: Not Applicable (for household members)  Specify who: N/A    Medication Access/Barriers: none    Do you have a current diagnosis for Hepatitis C?  Lab Results   Component Value Date    HEPCAB Reactive (A) 03/12/2019    HCVRNA Not Detected 03/27/2022       Have you used tobacco products four or more times per week in the last six months?  Yes    Systems analyst Eligibility Assessment  Patient has Marketplace coverage.     Patient given ACA education if they qualified based on answers to questions above.     MyChart  Do you have an active MyChart account? Yes     If MyChart is not set up, informed patient on how to set up MyChart No    Patient was informed of the following programs;   N/A    The following applications/handouts were given to patient:   N/A    The following forms were also started with the patient:   N/A    Juanell Fairly application status: Complete    Patient is applying for [employer] on Charges Only     Additional Comments: pt will send income docs via text. Confirmed phone number.    RW/Caps on Charges eligible. IPL= 205%; FPL= 205%. Expires: 10/23/2023       [facility]  Benefits Counselor  Time of Intervention: 15 mins

## 2022-10-25 NOTE — Unmapped (Signed)
[facility] Note      Reason for visit: Post operative visit    ASSESSMENT/PLAN:  Kevin [name] is overall doing well after cutting seton placement.  I was able to tighten an additional seton today in the office.  Has a follow-up with him in a couple weeks to assess to see if additional seton placement or tightening is needed.    HISTORY OF PRESENT ILLNESS:    Kevin [name] is here for a follow-up visit after treatment of an anal fistula with cutting seton.  Overall he is doing well.        PHYSICAL EXAMINATION:   VITALS: Blood pressure 109/76, pulse 81.  GENERAL: Comfortable, appears stated age, No distress,   SKIN: No lesions, ulcerations, breakdowns noted on exam  NEURO: alert and oriented x3   CARDIOVASCULAR: Regular Rate and Rhythm  RESPIRATORY: Normal work of breathing,adequate inspiratory/expiratory effort  ABDOMEN:  RECTAL: Will site healing well.  Seton in place.  One of the loose seton was tightened.  He tolerated placement without difficulty.         Thank you for the consult on this patient, and please do not hesitate to call or text me anytime if you have further questions,  Romero Belling MD [facility]  [facility]   [phone] cell  [phone] office

## 2022-11-07 ENCOUNTER — Ambulatory Visit: Admit: 2022-11-07 | Discharge: 2022-11-08 | Payer: PRIVATE HEALTH INSURANCE | Attending: Surgery | Primary: Surgery

## 2022-11-07 DIAGNOSIS — K603 Anal fistula: Principal | ICD-10-CM

## 2022-11-07 NOTE — Unmapped (Signed)
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  He currently has a cutting seton in place.  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  He notes considerable discomfort after tightening the seton during his last visit.  He is here today for a follow-up visit        PHYSICAL EXAMINATION:   VITALS: Blood pressure 125/75, pulse 85, height 170.2 cm (5' 7), weight 81.6 kg (179 lb 14.3 oz).  GENERAL: Comfortable, appears stated age, No distress,   SKIN: No lesions, ulcerations, breakdowns noted on exam  NEURO: alert and oriented x3   CARDIOVASCULAR: Regular Rate and Rhythm  RESPIRATORY: Normal work of breathing,adequate inspiratory/expiratory effort  ABDOMEN: Deferred  RECTAL: Surgical area healing well      Thank you for the consult on this patient, and please do not hesitate to call or text me anytime if you have further questions,  Romero Belling MD [facility]  [name]   [phone] cell  [phone] office

## 2022-11-09 NOTE — Unmapped (Signed)
[facility] Refill Coordination Note    Specialty Medication(s) to be Shipped:   Infectious Disease: Biktarvy    Other medication(s) to be shipped: No additional medications requested for fill at this time     Kevin [name], DOB: 1991/04/13  Phone: [phone] (home)       All above HIPAA information was verified with patient's family member, father.     Was a Nurse, learning disability used for this call? No    Completed refill call assessment today to schedule patient's medication shipment from the [facility] [phone]).  All relevant notes have been reviewed.     Specialty medication(s) and dose(s) confirmed: Regimen is correct and unchanged.   Changes to medications: Hosam reports no changes at this time.  Changes to insurance: No  New side effects reported not previously addressed with a pharmacist or physician: None reported  Questions for the pharmacist: No    Confirmed patient received a Conservation officer, historic buildings and a Surveyor, mining with first shipment. The patient will receive a drug information handout for each medication shipped and additional FDA Medication Guides as required.       DISEASE/MEDICATION-SPECIFIC INFORMATION        N/A    SPECIALTY MEDICATION ADHERENCE     Medication Adherence    Patient reported X missed doses in the last month: 0  Specialty Medication: biktarvy 50-200-25mg   Patient is on additional specialty medications: No                                Were doses missed due to medication being on hold? No    Biktarvy 50-200-25 mg: unsure days of medicine on hand        REFERRAL TO PHARMACIST     Referral to the pharmacist: Not needed      [facility]     Shipping address confirmed in Epic.     Delivery Scheduled: Yes, Expected medication delivery date: 11/10/22.     Medication will be delivered via Same Day Courier to the prescription address in Epic WAM.    Unk Lightning   [facility] Specialty Technician

## 2022-11-27 ENCOUNTER — Encounter
Admit: 2022-11-27 | Discharge: 2022-11-27 | Payer: PRIVATE HEALTH INSURANCE | Attending: Critical Care Medicine | Primary: Critical Care Medicine

## 2022-11-27 ENCOUNTER — Ambulatory Visit: Admit: 2022-11-27 | Discharge: 2022-11-27 | Payer: PRIVATE HEALTH INSURANCE

## 2022-11-27 MED ORDER — OXYCODONE 5 MG TABLET
ORAL_TABLET | Freq: Three times a day (TID) | ORAL | 0 refills | 7 days | Status: CP | PRN
Start: 2022-11-27 — End: 2022-12-04
  Filled 2022-11-27: qty 20, 7d supply, fill #0

## 2022-11-27 MED ADMIN — lidocaine (PF) (XYLOCAINE) injection: INTRAVENOUS | @ 18:00:00 | Stop: 2022-11-27

## 2022-11-27 MED ADMIN — fentaNYL (PF) (SUBLIMAZE) injection: INTRAVENOUS | @ 18:00:00 | Stop: 2022-11-27

## 2022-11-27 MED ADMIN — bupivacaine LIPOSOMAL (PF) (EXPAREL) 20 mL, sodium chloride bacteriostatic 0.9 % 30 mL: @ 18:00:00 | Stop: 2022-11-27

## 2022-11-27 MED ADMIN — midazolam (VERSED) injection: INTRAVENOUS | @ 18:00:00 | Stop: 2022-11-27

## 2022-11-27 MED ADMIN — lactated Ringers infusion: 40 mL/h | INTRAVENOUS | @ 16:00:00 | Stop: 2022-11-27

## 2022-11-27 MED ADMIN — Propofol (DIPRIVAN) injection: INTRAVENOUS | @ 18:00:00 | Stop: 2022-11-27

## 2022-11-27 MED ADMIN — sodium chloride irrigation (NS) 0.9 % irrigation solution: @ 18:00:00 | Stop: 2022-11-27

## 2022-11-27 MED ADMIN — ketamine (KETALAR) injection: INTRAVENOUS | @ 18:00:00 | Stop: 2022-11-27

## 2022-11-27 MED ADMIN — ondansetron (ZOFRAN) injection: INTRAVENOUS | @ 18:00:00 | Stop: 2022-11-27

## 2022-11-27 NOTE — Unmapped (Signed)
Operative Note  (CSN: [id number])      Date of Surgery: 11/27/2022    Pre-op Diagnosis: ANAL FISTULA    Post-op Diagnosis: Anal fistula    Procedure(s):  1.  Rectal exam under anesthesia  2.  Removal seton  3.  Anal fistulotomy    Performing Service: General Surgery  Surgeon(s) and Role:     * Ree Kida, MD - Primary    Assistant: None    Indication for procedure: 32 year old male with a complex anal fistula, we have been using his cutting seton, however he was unable to tolerate adjustment of the seton in the clinic during his last clinic visit.    Procedure: After appropriate surgical timeout, we started procedure.  Local anesthetic was injected into the anal canal.  A block was performed.  We then evaluated the anal area.  Patient has a cutting seton in place.  The seton is essentially cut through most of the fistula tract.  The seton was removed.  After removal of the seton we then evaluated the anal canal we placed a probe into the fistula tract.  The fistulotomy was completed using electrocautery.  We curetted the base of the fistula tract.  We then carefully evaluated for hemostasis.  Fibrillar was placed in the anal canal.  The surgical site was dressed with gauze.  He tolerated the procedure well.  Surgeon attestation: I performed the procedure.    Findings: Anal fistula    Anesthesia: Monitor Anesthesia Care    Estimated Blood Loss: 5 mL    Complications: None    Specimens: None collected    Implants: * No implants in log *    Surgeon Notes: I was present and scrubbed for the entire procedure    Ree Kida, MD   Date: 11/27/2022  Time: 2:03 PM

## 2022-11-27 NOTE — Unmapped (Signed)
Anesthesia Side Effects:                 Today we gave you an anesthetic or a sedative.     We require you have a responsible adult with you the rest of the day and overnight, for your safety.     You should NOT do the following for the next 24 hours or at any time while taking prescription pain medicine:  Do not drink alcohol  Do not sign legal documents  Do not drive a car or operate heavy machinery  Do not use power tools    You may have a very dry mouth and sore throat. Warm saltwater gargles, drinking cool or warm beverages may help to soothe your throat.     It is normal to feel tired and weak after receiving anesthesia. You should limit your activities today and rest at home.     After anesthesia, it is better to start with a light liquid diet such as soft drinks, tea, Jell-O or broth.  Next you can try soup and crackers.   Finally work up to solid foods. Please remember that it is very important for you to drink liquids during the next 24 hours.    The amount of pain you have may differ from what other people experience.  Your pain may be unpredictable. Call your surgeon if you cannot control your pain by using the pain medicine you were prescribed.

## 2022-11-29 NOTE — Unmapped (Signed)
[facility] Pharm[facility] Refill Coordination Note    Specialty Medication(s) to be Shipped:   Infectious Disease: Biktarvy    Other medication(s) to be shipped: No additional medications requested for fill at this time     Kevin [name], DOB: 09-06-1991  Phone: [phone] (home)       All above HIPAA information was verified with patient's family member, Spoke to Larrie's dad today..     Was a translator used for this call? No    Completed refill call assessment today to schedule patient's medication shipment from the [facility] [phone]).  All relevant notes have been reviewed.     Specialty medication(s) and dose(s) confirmed: Regimen is correct and unchanged.   Changes to medications: Jett reports no changes at this time.  Changes to insurance: No  New side effects reported not previously addressed with a pharmacist or physician: None reported  Questions for the pharmacist: No    Confirmed patient received a Conservation officer, historic buildings and a Surveyor, mining with first shipment. The patient will receive a drug information handout for each medication shipped and additional FDA Medication Guides as required.       DISEASE/MEDICATION-SPECIFIC INFORMATION        N/A    SPECIALTY MEDICATION ADHERENCE     Medication Adherence    Patient reported X missed doses in the last month: 0  Specialty Medication: Biktarvy 50-200-25mg   Patient is on additional specialty medications: No                                Were doses missed due to medication being on hold? No    Biktarvy 50-200-25mg  : 7 days of medicine on hand     REFERRAL TO PHARMACIST     Referral to the pharmacist: Not needed      [facility]     Shipping address confirmed in Epic.     Delivery Scheduled: Yes, Expected medication delivery date: 12/05/22.     Medication will be delivered via Same Day Courier to the prescription address in Epic WAM.    Tera Helper, [facility]   [facility] Specialty Pharmacist

## 2022-12-04 MED FILL — BIKTARVY 50 MG-200 MG-25 MG TABLET: ORAL | 30 days supply | Qty: 30 | Fill #1

## 2023-01-02 ENCOUNTER — Ambulatory Visit: Admit: 2023-01-02 | Discharge: 2023-01-03 | Payer: PRIVATE HEALTH INSURANCE | Attending: Surgery | Primary: Surgery

## 2023-01-03 NOTE — Unmapped (Signed)
[facility]      Reason for visit: Postop    ASSESSMENT/PLAN:  32 year old male status post anal fistulotomy.  He is doing well without any postprocedural issues.  Follow-up with me as needed.      HISTORY OF PRESENT ILLNESS:    Kevin [name] is here today for postoperative visit status post treatment of an anal fistula.  He notes that he is doing well.  He denies having any pain or discomfort after the procedure.  He is resuming normal activity.         PHYSICAL EXAMINATION:   VITALS: There were no vitals taken for this visit.  GENERAL: Comfortable, appears stated age, No distress,   SKIN: No lesions, ulcerations, breakdowns noted on exam  NEURO: alert and oriented x3   CARDIOVASCULAR: Regular Rate and Rhythm  RESPIRATORY: Normal work of breathing,adequate inspiratory/expiratory effort  ABDOMEN: Deferred  RECTAL: Surgical site healed         Thank you for the consult on this patient, and please do not hesitate to call or text me anytime if you have further questions,  Romero Belling MD [facility]  [facility]   [phone] cell  [phone] office

## 2023-01-04 NOTE — Unmapped (Signed)
[facility] Special[facility] Pharmacy Refill Coordination Note    Specialty Medication(s) to be Shipped:   Infectious Disease: Biktarvy    Other medication(s) to be shipped: No additional medications requested for fill at this time     Kevin [name], DOB: 1991-01-26  Phone: [phone] (home)       All above HIPAA information was verified with patient's family member, father.     Was a Nurse, learning disability used for this call? No    Completed refill call assessment today to schedule patient's medication shipment from the [facility] [phone]).  All relevant notes have been reviewed.     Specialty medication(s) and dose(s) confirmed: Regimen is correct and unchanged.   Changes to medications: Leray reports no changes at this time.  Changes to insurance: No  New side effects reported not previously addressed with a pharmacist or physician: None reported  Questions for the pharmacist: No    Confirmed patient received a Conservation officer, historic buildings and a Surveyor, mining with first shipment. The patient will receive a drug information handout for each medication shipped and additional FDA Medication Guides as required.       DISEASE/MEDICATION-SPECIFIC INFORMATION        N/A    SPECIALTY MEDICATION ADHERENCE     Medication Adherence    Patient reported X missed doses in the last month: 0  Specialty Medication: biktarvy 50-200-25mg   Patient is on additional specialty medications: No  Patient is on more than two specialty medications: No  Any gaps in refill history greater than 2 weeks in the last 3 months: no  Demonstrates understanding of importance of adherence: yes  Informant: father  Reliability of informant: reliable  Provider-estimated medication adherence level: good  Patient is at risk for Non-Adherence: No  Reasons for non-adherence: no problems identified  Confirmed plan for next specialty medication refill: delivery by pharmacy  Refills needed for supportive medications: not needed Refill Coordination    Has the Patients' Contact Information Changed: No  Is the Shipping Address Different: No         Were doses missed due to medication being on hold? No    Biktarvy  50-200-25 mg: 7 days of medicine on hand       REFERRAL TO PHARMACIST     Referral to the pharmacist: Not needed      [facility]     Shipping address confirmed in Epic.     Patient was notified of new phone menu : No    Delivery Scheduled: Yes, Expected medication delivery date: 02/20.     Medication will be delivered via Next Day Courier to the prescription address in Epic WAM.    [name]   [facility] Specialty Technician

## 2023-01-08 MED FILL — BIKTARVY 50 MG-200 MG-25 MG TABLET: ORAL | 30 days supply | Qty: 30 | Fill #2

## 2023-02-02 NOTE — Unmapped (Signed)
[facility] Special[facility] Pharmacy Clinical Assessment & Refill Coordination Note    Kevin [name], DOB: [birth year]  Phone: [phone] (home)     All above HIPAA information was verified with patient's family member, Kevin [name] (father).     Was a Nurse, learning disability used for this call? No    Specialty Medication(s):   Infectious Disease: Biktarvy     Current Outpatient Medications   Medication Sig Dispense Refill    bictegrav-emtricit-tenofov ala (BIKTARVY) 50-200-25 mg tablet Take 1 tablet by mouth daily. 30 tablet 5     No current facility-administered medications for this visit.        Changes to medications:  no changes    Allergies   Allergen Reactions    Doxycycline Other (See Comments)     GI upset       Changes to allergies: No    SPECIALTY MEDICATION ADHERENCE     Biktarvy 50-200-25 mg: approximately 7 days of medicine on hand       Medication Adherence    Patient reported X missed doses in the last month: 0  Specialty Medication: Biktarvy 50-200-25mg   Patient is on additional specialty medications: No  Any gaps in refill history greater than 2 weeks in the last 3 months: no  Demonstrates understanding of importance of adherence: yes  Informant: father  Reliability of informant: reliable  Provider-estimated medication adherence level: good  Patient is at risk for Non-Adherence: No          Specialty medication(s) dose(s) confirmed: Regimen is correct and unchanged.     Are there any concerns with adherence? No    Adherence counseling provided? Not needed    CLINICAL MANAGEMENT AND INTERVENTION      Clinical Benefit Assessment:    Do you feel the medicine is effective or helping your condition? Yes    HIV ASSOCIATED LABS:     Lab Results   Component Value Date/Time    HIVRS Not Detected 10/23/2022 04:19 PM    HIVRS Detected (A) 03/27/2022 04:25 PM    HIVRS Not Detected 05/04/2021 10:54 AM    HIVCP <20 (H) 03/27/2022 04:25 PM    HIVCP 86 (H) 12/23/2019 02:11 PM    HIVCP 34,583 (H) 03/12/2019 04:46 PM    ACD4 684 03/27/2022 04:25 PM    ACD4 360 (L) 05/04/2021 10:54 AM    ACD4 528 11/04/2020 10:36 AM       Clinical Benefit counseling provided? Labs from 10/23/22 show evidence of clinical benefit    Adverse Effects Assessment:    Are you experiencing any side effects? No    Are you experiencing difficulty administering your medicine? No    Quality of Life Assessment:      How many days over the past month did your HIV  keep you from your normal activities? For example, brushing your teeth or getting up in the morning. 0    Have you discussed this with your provider? Not needed    Acute Infection Status:    Acute infections noted within Epic:  No active infections  Patient reported infection: None    Therapy Appropriateness:    Is therapy appropriate and patient progressing towards therapeutic goals? Yes, therapy is appropriate and should be continued    DISEASE/MEDICATION-SPECIFIC INFORMATION      N/A    HIV: Not Applicable    PATIENT SPECIFIC NEEDS     Does the patient have any physical, cognitive, or cultural barriers? No    Is  the patient high risk? No    Did the patient require a clinical intervention? No    Does the patient require physician intervention or other additional services (i.e., nutrition, smoking cessation, social work)? No    SOCIAL DETERMINANTS OF HEALTH     At the [facility], we have learned that life circumstances - like trouble affording food, housing, utilities, or transportation can affect the health of many of our patients.   That is why we wanted to ask: are you currently experiencing any life circumstances that are negatively impacting your health and/or quality of life? Patient declined to answer    Social Determinants of Health     Financial Resource Strain: Not on file   Internet Connectivity: Not on file   Food Insecurity: Not on file   Tobacco Use: Medium Risk (01/02/2023)    Patient History     Smoking Tobacco Use: Former     Smokeless Tobacco Use: Never     Passive Exposure: Not on file   Housing/Utilities: Not on file   Alcohol Use: Not on file   Transportation Needs: Not on file   Substance Use: Not on file   Health Literacy: Not on file   Physical Activity: Sufficiently Active (03/12/2019)    Exercise Vital Sign     Days of Exercise per Week: 3 days     Minutes of Exercise per Session: 60 min   Interpersonal Safety: Not on file   Stress: Stress Concern Present (03/12/2019)    [employer] of Occupational Health - Occupational Stress Questionnaire     Feeling of Stress : Rather much   Intimate Partner Violence: Not At Risk (03/12/2019)    Humiliation, Afraid, Rape, and Kick questionnaire     Fear of Current or Ex-Partner: No     Emotionally Abused: No     Physically Abused: No     Sexually Abused: No   Depression: Not at risk (09/29/2022)    Received from [facility]    PHQ-2     Patient Health Questionnaire-2 Score: 0   Social Connections: Unknown (03/12/2019)    Social Connection and Isolation Panel [NHANES]     Frequency of Communication with Friends and Family: More than three times a week     Frequency of Social Gatherings with Friends and Family: More than three times a week     Attends Religious Services: Never     Database administrator or Organizations: No     Attends Banker Meetings: Never     Marital Status: Not on file       Would you be willing to receive help with any of the needs that you have identified today? Not applicable       SHIPPING     Specialty Medication(s) to be Shipped:   Infectious Disease: Biktarvy    Other medication(s) to be shipped: No additional medications requested for fill at this time     Changes to insurance: No    Patient was informed of new phone menu: Yes    Delivery Scheduled: Yes, Expected medication delivery date: 02/05/23.     Medication will be delivered via Same Day Courier to the confirmed prescription address in [facility].    The patient will receive a drug information handout for each medication shipped and additional FDA Medication Guides as required.  Verified that patient has previously received a Conservation officer, historic buildings and a Surveyor, mining.    The patient or  caregiver noted above participated in the development of this care plan and knows that they can request review of or adjustments to the care plan at any time.      All of the patient's questions and concerns have been addressed.    Roderic Palau, PharmD   [facility] Specialty Pharmacist

## 2023-02-05 MED FILL — BIKTARVY 50 MG-200 MG-25 MG TABLET: ORAL | 30 days supply | Qty: 30 | Fill #3

## 2023-03-13 NOTE — Unmapped (Signed)
[facility] Specialty Pharmacy Refill Coordination Note    Specialty Medication(s) to be Shipped:   Infectious Disease: Biktarvy    Other medication(s) to be shipped: No additional medications requested for fill at this time     Kevin [name], DOB: 1991-08-05  Phone: [phone] (home)       All above HIPAA information was verified with patient's family member, father.     Was a Nurse, learning disability used for this call? No    Completed refill call assessment today to schedule patient's medication shipment from the [facility] [phone]).  All relevant notes have been reviewed.     Specialty medication(s) and dose(s) confirmed: Regimen is correct and unchanged.   Changes to medications: Everardo reports no changes at this time.  Changes to insurance: No  New side effects reported not previously addressed with a pharmacist or physician: None reported  Questions for the pharmacist: No    Confirmed patient received a Conservation officer, historic buildings and a Surveyor, mining with first shipment. The patient will receive a drug information handout for each medication shipped and additional FDA Medication Guides as required.       DISEASE/MEDICATION-SPECIFIC INFORMATION        N/A    SPECIALTY MEDICATION ADHERENCE     Medication Adherence    Patient reported X missed doses in the last month: 0  Specialty Medication: biktarvy 50-200-25mg   Patient is on additional specialty medications: No  Patient is on more than two specialty medications: No  Any gaps in refill history greater than 2 weeks in the last 3 months: no  Demonstrates understanding of importance of adherence: yes  Informant: patient  Confirmed plan for next specialty medication refill: delivery by pharmacy  Refills needed for supportive medications: not needed          Refill Coordination    Has the Patients' Contact Information Changed: No  Is the Shipping Address Different: No         Were doses missed due to medication being on hold? No    BIKTARVY 50-200-25  mg: 5 days of medicine on hand       REFERRAL TO PHARMACIST     Referral to the pharmacist: Not needed      [facility]     Shipping address confirmed in Epic.       Delivery Scheduled: Yes, Expected medication delivery date: 03/15/2023.     Medication will be delivered via Next Day Courier to the prescription address in Epic WAM.    [name]   [facility] Specialty Technician

## 2023-03-14 MED FILL — BIKTARVY 50 MG-200 MG-25 MG TABLET: ORAL | 30 days supply | Qty: 30 | Fill #4

## 2023-04-09 NOTE — Unmapped (Signed)
Attempted to reach out to patient about a appointment request done via mychart.

## 2023-04-12 NOTE — Unmapped (Signed)
[facility] Specialty Pharmacy Refill Coordination Note    Specialty Medication(s) to be Shipped:   Infectious Disease: Biktarvy    Other medication(s) to be shipped: No additional medications requested for fill at this time     Kevin [name], DOB: 1991/08/29  Phone: [phone] (home)       All above HIPAA information was verified with patient's family member, father.     Was a Nurse, learning disability used for this call? No    Completed refill call assessment today to schedule patient's medication shipment from the [facility] [phone]).  All relevant notes have been reviewed.     Specialty medication(s) and dose(s) confirmed: Regimen is correct and unchanged.   Changes to medications: Kevin [name] reports no changes at this time.  Changes to insurance: No  New side effects reported not previously addressed with a pharmacist or physician: None reported  Questions for the pharmacist: No    Confirmed patient received a Conservation officer, historic buildings and a Surveyor, mining with first shipment. The patient will receive a drug information handout for each medication shipped and additional FDA Medication Guides as required.       DISEASE/MEDICATION-SPECIFIC INFORMATION        N/A    SPECIALTY MEDICATION ADHERENCE     Medication Adherence    Patient reported X missed doses in the last month: 0  Specialty Medication: BIKTARVY 50-200-25 mg tablet (bictegrav-emtricit-tenofov ala)  Patient is on additional specialty medications: No  Any gaps in refill history greater than 2 weeks in the last 3 months: no  Demonstrates understanding of importance of adherence: yes  Informant: patient  Confirmed plan for next specialty medication refill: delivery by pharmacy  Refills needed for supportive medications: not needed          Refill Coordination    Has the Patients' Contact Information Changed: No  Is the Shipping Address Different: No         Were doses missed due to medication being on hold? No    BIKTARVY 50-200-25 mg: 5 days of medicine on hand       REFERRAL TO PHARMACIST     Referral to the pharmacist: Not needed      [facility]     Shipping address confirmed in Epic.       Delivery Scheduled: Yes, Expected medication delivery date: 04/13/2023.     Medication will be delivered via Same Day Courier to the prescription address in Epic WAM.    [name]   [facility] Specialty Technician

## 2023-04-13 MED FILL — BIKTARVY 50 MG-200 MG-25 MG TABLET: ORAL | 30 days supply | Qty: 30 | Fill #5

## 2023-04-30 ENCOUNTER — Ambulatory Visit: Admit: 2023-04-30 | Discharge: 2023-05-01 | Payer: PRIVATE HEALTH INSURANCE

## 2023-04-30 LAB — CBC W/ AUTO DIFF
BASOPHILS ABSOLUTE COUNT: 0.1 10*9/L (ref 0.0–0.1)
BASOPHILS RELATIVE PERCENT: 0.8 %
EOSINOPHILS ABSOLUTE COUNT: 0.1 10*9/L (ref 0.0–0.5)
EOSINOPHILS RELATIVE PERCENT: 1.2 %
HEMATOCRIT: 44.5 % (ref 39.0–48.0)
HEMOGLOBIN: 15.5 g/dL (ref 12.9–16.5)
LYMPHOCYTES ABSOLUTE COUNT: 2.1 10*9/L (ref 1.1–3.6)
LYMPHOCYTES RELATIVE PERCENT: 24.7 %
MEAN CORPUSCULAR HEMOGLOBIN CONC: 34.7 g/dL (ref 32.0–36.0)
MEAN CORPUSCULAR HEMOGLOBIN: 31.7 pg (ref 25.9–32.4)
MEAN CORPUSCULAR VOLUME: 91.1 fL (ref 77.6–95.7)
MEAN PLATELET VOLUME: 9 fL (ref 6.8–10.7)
MONOCYTES ABSOLUTE COUNT: 0.8 10*9/L (ref 0.3–0.8)
MONOCYTES RELATIVE PERCENT: 8.9 %
NEUTROPHILS ABSOLUTE COUNT: 5.4 10*9/L (ref 1.8–7.8)
NEUTROPHILS RELATIVE PERCENT: 64.4 %
PLATELET COUNT: 237 10*9/L (ref 150–450)
RED BLOOD CELL COUNT: 4.89 10*12/L (ref 4.26–5.60)
RED CELL DISTRIBUTION WIDTH: 13.1 % (ref 12.2–15.2)
WBC ADJUSTED: 8.4 10*9/L (ref 3.6–11.2)

## 2023-04-30 LAB — AST: AST (SGOT): 26 U/L (ref <=34)

## 2023-04-30 LAB — BASIC METABOLIC PANEL
ANION GAP: 8 mmol/L (ref 5–14)
BLOOD UREA NITROGEN: 11 mg/dL (ref 9–23)
BUN / CREAT RATIO: 11
CALCIUM: 9.3 mg/dL (ref 8.7–10.4)
CHLORIDE: 106 mmol/L (ref 98–107)
CO2: 25 mmol/L (ref 20.0–31.0)
CREATININE: 0.96 mg/dL
EGFR CKD-EPI (2021) MALE: >90 mL/min/{1.73_m2} (ref >=60)
GLUCOSE RANDOM: 96 mg/dL (ref 70–179)
POTASSIUM: 3.6 mmol/L (ref 3.4–4.8)
SODIUM: 139 mmol/L (ref 135–145)

## 2023-04-30 LAB — BILIRUBIN, TOTAL: BILIRUBIN TOTAL: 0.5 mg/dL (ref 0.3–1.2)

## 2023-04-30 LAB — ALT: ALT (SGPT): 37 U/L (ref 10–49)

## 2023-04-30 MED ORDER — ESCITALOPRAM 10 MG TABLET
ORAL_TABLET | Freq: Every day | ORAL | 0 refills | 90 days | Status: CP
Start: 2023-04-30 — End: ?
  Filled 2023-05-07: qty 90, 90d supply, fill #0

## 2023-04-30 MED ORDER — BIKTARVY 50 MG-200 MG-25 MG TABLET
ORAL_TABLET | Freq: Every day | ORAL | 5 refills | 30 days | Status: CP
Start: 2023-04-30 — End: ?
  Filled 2023-05-07: qty 30, 30d supply, fill #0

## 2023-04-30 NOTE — Unmapped (Addendum)
[facility]  [address]  [place], [place]  [address]  P [phone]  F [phone]     Primary care provider: Wynona Neat, PA    Assessment/Plan:      HIV (dx'd 02/2019, nadir CD4 241 / 20% in 02/2019)  - chronic, stable   Overall doing well. Current regimen: Biktarvy (BIC/FTC/TAF)  Misses doses of ARVs rarely    CD4 count over 300 for >2Y on suppressive ART; no prophylaxis needed; recheck CD4 annually  Discussed ARV adherence and U=U              - Not interested in LAI ART.    Lab Results   Component Value Date    ACD4 840 04/30/2023    CD4 40 04/30/2023    HIVRS Not Detected 04/30/2023    HIVCP <20 (H) 03/27/2022     CD4, HIV RNA, and safety labs   Continue current therapy  Encouraged continued excellent ARV adherence      Anxiety  Depression  - Recently has experienced symptoms c/w depression and anxiety. He also notes some past trauma that he would like to process. PHQ-9=15, GAD-7=16.   - Start escitalopram 10mg  1/2 tablet once daily x 1 week. If tolerating, can increase to 1 tablet daily. Discussed common SE.  - He will benefit from therapy, and is interested in finding a local therapist in [place]. Reviewed [employer] [facility] and he will plan to do research and schedule an appointment.  - Follow-up in 1 month to assess response to therapy.   -     escitalopram oxalate (LEXAPRO) 10 MG tablet; Take 1 tablet (10 mg total) by mouth daily.      Sexual health & secondary prevention  -     Sexually active with men. New sexual partner(s) since last visit.  Lab Results   Component Value Date    RPR Nonreactive 04/30/2023    RPR Nonreactive 10/23/2022    CTNAA Negative 04/30/2023    CTNAA Negative 04/30/2023    CTNAA Negative 04/30/2023    GCNAA Negative 04/30/2023    GCNAA Negative 04/30/2023    GCNAA Negative 04/30/2023    SPECSOURCE Rectum 04/30/2023    SPECSOURCE Throat 04/30/2023    SPECSOURCE Urine 04/30/2023       GC/CT NAATs - obtained today from all exposed anatomical site(s)  RPR - for screening obtained today    Health maintenance    Metabolic conditions  Wt Readings from Last 5 Encounters:   04/30/23 83 kg (183 lb)   11/27/22 77.8 kg (171 lb 8.3 oz)   11/07/22 81.6 kg (179 lb 14.3 oz)   10/23/22 81.6 kg (180 lb)   10/01/22 81.6 kg (180 lb)     Lab Results   Component Value Date    CREATININE 0.96 04/30/2023    GLUCOSEU Negative 03/12/2019    GLU 96 04/30/2023    A1C 5.0 03/12/2019    ALT 37 04/30/2023    ALT 40 10/23/2022    ALT 36 03/27/2022     # Kidney health - creatinine today  # Bone health - assessment not yet needed (under age 27)  # Diabetes assessment - random glucose today  # NAFLD assessment - suspicion for NAFLD low    Communicable diseases  Lab Results   Component Value Date    QFTTBGOLD Negative 03/12/2019    HEPAIGG Nonreactive 03/12/2019    HEPBSAB Nonreactive 03/27/2022    HEPCAB Reactive (A) 03/12/2019  HCVRNA Not Detected 03/27/2022     # TB screening - no longer needed; negative IGRA, low risk  # Hepatitis screening - repeat HCV screen periodically    Cancer screening  Lab Results   Component Value Date    FINALDX  04/06/2020     A: Soft tissue, left neck, excision  - Epidermal cyst, pilar type    This electronic signature is attestation that the pathologist personally reviewed the submitted material(s) and the final diagnosis reflects that evaluation.         Cardiovascular disease  Lab Results   Component Value Date    CHOL 169 03/12/2019    HDL 26 (L) 03/12/2019    LDL 123 (H) 03/12/2019    NONHDL 143 03/12/2019    TRIG 100 03/12/2019     Immunization History   Administered Date(s) Administered    COVID-19 VACCINE,MRNA(MODERNA)(PF) 08/03/2020     Immunizations needed - patient declines recommended immunization(s)    I personally spent 32 minutes face-to-face and non-face-to-face in the care of this patient, which includes all pre, intra, and post visit time on the date of service.    Disposition  Next appointment: 4 weeks    Lahoma Rocker, PA-C  [facility]   [address]  [place], [place].  [address]  Phone: [phone]   Fax: [phone]      Subjective        HPI  In addition to details in A&P above:    Kevin [name] is a 32 yo man with a history of well controlled HIV, syphilis, anal fistula here for HIV follow-up.    He is taking Biktarvy daily with good adherence. He's happy with this regimen.  He is post anal fistulotomy and doing well- no more pain or discharge, and very happy that his symptoms have been addressed.    He notes today feeling more anxious than usual and also feeling depressive symptoms. He has noticed low interest in doing things that used to excite him, and also withdrawing. He is usually very social, but recently has only wanted to see people very close to him and no longer likes to meet new people or be in social situations. He feels that there are parts of his past that he is trying to suppressed or push down but that it isn't working. He wants to try medication as well as therapy.    PHQ-9 PHQ-9 TOTAL SCORE   04/30/2023   5:15 PM 15   03/12/2019   4:00 PM 7       GAD7 Total Score GAD-7 Total Score   04/30/2023   5:15 PM 16   03/12/2019   4:00 PM 9       STI and Mammography Patient Questionnaire (.IDQUEST) for April 30, 2023    Have you been sexually active in the last 1 year? Yes  Would you like Sexually Transmitted Infection (STI) testing today? Yes  Have you had a mammogram completed in the last 1 year? NA    Past Medical History:   Diagnosis Date    Anal fistula     History of COVID-19 2021    History of syphilis     HIV disease (CMS-HCC)      Social History    Housing - in house in [place].  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     Social History     Socioeconomic History  Marital status: Single     Spouse name: None    Number of children: None    Years of education: None    Highest education level: None   Tobacco Use    Smoking status: Former Current packs/day: 0.00     Types: Cigarettes     Quit date: 2020     Years since quitting: 4.4    Smokeless tobacco: Never   Vaping Use    Vaping status: Every Day    Substances: Nicotine   Substance and Sexual Activity    Alcohol use: Yes     Alcohol/week: 8.0 standard drinks of alcohol     Types: 8 Shots of liquor per week     Comment: weekends    Drug use: Yes     Frequency: 7.0 times per week     Types: Marijuana     Comment: smokes weed daily    Sexual activity: Yes     Partners: Male     Birth control/protection: Condom     Social Determinants of Health     Physical Activity: Sufficiently Active (03/12/2019)    Exercise Vital Sign     Days of Exercise per Week: 3 days     Minutes of Exercise per Session: 60 min   Stress: Stress Concern Present (03/12/2019)    [employer] of Occupational Health - Occupational Stress Questionnaire     Feeling of Stress : Rather much   Social Connections: Unknown (03/12/2019)    Social Connection and Isolation Panel [NHANES]     Frequency of Communication with Friends and Family: More than three times a week     Frequency of Social Gatherings with Friends and Family: More than three times a week     Attends Religious Services: Never     Database administrator or Organizations: No     Attends Banker Meetings: Never     Medications and Allergies  He has a current medication list which includes the following prescription(s): biktarvy and escitalopram oxalate.    Allergies: Doxycycline    Family History  His family history includes Alzheimer's disease in his maternal grandfather and paternal grandfather; Crohn's disease in his father; Diabetes in his maternal grandmother; Heart failure in his maternal grandmother; Lymphoma in his paternal grandmother.     Objective      BP 121/78 (BP Site: L Arm, BP Position: Sitting, BP Cuff Size: Medium)  - Pulse 103  - Temp 36.7 ??C (98 ??F) (Tympanic)  - Ht 170.2 cm (5' 7)  - Wt 83 kg (183 lb)  - BMI 28.66 kg/m??      Physical Exam  Vitals reviewed.   Constitutional:       Appearance: Normal appearance.   HENT:      Mouth/Throat:      Mouth: Mucous membranes are moist.      Pharynx: Oropharynx is clear.   Cardiovascular:      Rate and Rhythm: Regular rhythm. Tachycardia present.   Pulmonary:      Effort: Pulmonary effort is normal.      Breath sounds: Normal breath sounds.   Skin:     General: Skin is warm and dry.   Neurological:      General: No focal deficit present.      Mental Status: He is alert and oriented to person, place, and time.   Psychiatric:         Mood and Affect: Mood normal.  Behavior: Behavior normal.         Thought Content: Thought content normal. Thought content does not include homicidal or suicidal ideation.         Judgment: Judgment normal.      Comments: Slightly tearful at times

## 2023-05-01 LAB — LYMPH MARKER LIMITED,FLOW
ABSOLUTE CD3 CNT: 1617 {cells}/uL (ref 915–3400)
ABSOLUTE CD4 CNT: 840 {cells}/uL (ref 510–2320)
ABSOLUTE CD8 CNT: 672 {cells}/uL (ref 180–1520)
CD3% (T CELLS): 77 % (ref 61–86)
CD4% (T HELPER): 40 % (ref 34–58)
CD4:CD8 RATIO: 1.3 (ref 0.9–4.8)
CD8% T SUPPRESR: 32 % (ref 12–38)

## 2023-05-01 LAB — SYPHILIS SCREEN: SYPHILIS RPR SCREEN: NONREACTIVE

## 2023-05-02 LAB — HIV RNA, QUANTITATIVE, PCR: HIV RNA QNT RSLT: NOT DETECTED

## 2023-05-28 ENCOUNTER — Telehealth: Admit: 2023-05-28 | Discharge: 2023-05-29 | Payer: PRIVATE HEALTH INSURANCE

## 2023-05-28 DIAGNOSIS — B20 Human immunodeficiency virus [HIV] disease: Principal | ICD-10-CM

## 2023-05-28 DIAGNOSIS — F32A Depression, unspecified depression type: Principal | ICD-10-CM

## 2023-05-28 DIAGNOSIS — F419 Anxiety disorder, unspecified: Principal | ICD-10-CM

## 2023-05-28 NOTE — Unmapped (Addendum)
Assessment/Plan:      Kevin [name], a 32 y.o. male seen today for follow-up evaluation of depression/anxiety.    Plan:    Diagnoses and all orders for this visit:    Anxiety  Depression  - He started escitalopram 10mg  ~ two weeks ago. He hasn't noticed any change in anxiety or depressive symptoms since starting. He hasn't noted any side effects either at this dose.   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  - He will continue escitalopram for 1 week at 10mg , and then increase to 20mg  as tolerated if he is not noticing improvement of symptoms at 10mg .  - He plans to get a therapist, and has [facility] on his phone to look for local providers. I will also reach out to some providers here to see if they have openings.  - Short term goals will be to find a therapist and to think more about ways to decrease mja use.     HIV  Doing well from an HIV perspective.  Fills ART via private insurance.   Lab Results   Component Value Date    ACD4 840 04/30/2023    HIVCP <20 (H) 03/27/2022    HIVRS Not Detected 04/30/2023     Continue current therapy.  No labs today.  Encouraged continued excellent ARV adherence.      Disposition  Return to clinic 1-2 weeks or sooner if needed.    Lahoma Rocker, PA-C  [facility]   [address]  [place], [place].  [address]  Phone: [phone]   Fax: [phone]          The patient reports they are physically located in [place] and is currently: at home. I conducted a phone visit.  I spent 20 minutes on the phone call with the patient on the date of service .       Subjective:      Chief Complaint   Anxiety  Depression    HPI  Return visit for Kevin [name], a 31 y.o. male presenting for the above concerns.    Today, he reports he started escitalopram around 2 weeks ago. He's taking 1 pill (10mg ), but hasn't noticed any change in symptoms. He's also not experiencing any side effects from the medication- no nausea, diarrhea, sexual side effects, drowsiness, headache, insomnia.    He recently has been busy with some vacations, so hasn't been able to set up a therapy appointment, but plans to do so.    He noticed recently he is anxious about a number of things- he's worried his parents might get sick or something bad will happen to them, despite them being in their early 25s and healthy, or that his brother might get hurt. He finds that he can't enjoy down time because if he's home trying to relax he will go into dark places and feel anxious about things or feel bad about himself.     He's felt this way since he was in high school, but hasn't ever felt ready to discuss it further with someone or take medication. He now wants to address these things so he can live a healthy life and be happy.     He also discussed the role of mja in his life- in the past it was a fun, social thing to do, but now he finds he smokes daily just out of  habit or because he is worried how he'll feel if he doesn't smoke. At the same time, he notes that he'll get more anxious after smoking, so it isn't enjoyable any more. He would like to decrease use, although finds it difficult as everyone around him smokes. He used to also find similar response with alcohol, but feels alcohol use is less of an issue now- he mainly drinks socially on the weekends.    He focuses on the things he *isn't* doing instead of the things he is doing well for himself and his health. He's trying to reframe some of these thoughts to focus on the good things in his life. He wants to be able to be a good partner, and feels it will be hard to do so unless he addresses some of these things in his life.     Past Medical History:   Diagnosis Date    Anal fistula     History of COVID-19 2021    History of syphilis     HIV disease (CMS-HCC)      Medications and Allergies   Reviewed and updated today. See bottom of this visit's encounter summary for details.  Current Outpatient Medications on File Prior to Visit   Medication Sig    bictegrav-emtricit-tenofov ala (BIKTARVY) 50-200-25 mg tablet Take 1 tablet by mouth daily.    escitalopram oxalate (LEXAPRO) 10 MG tablet Take 1 tablet (10 mg total) by mouth daily.     No current facility-administered medications on file prior to visit.       Allergies   Allergen Reactions    Doxycycline Other (See Comments)     GI upset       Social History  Social History     Tobacco Use    Smoking status: Former     Current packs/day: 0.00     Types: Cigarettes     Quit date: 2020     Years since quitting: 4.5    Smokeless tobacco: Never   Substance Use Topics    Alcohol use: Yes     Alcohol/week: 8.0 standard drinks of alcohol     Types: 8 Shots of liquor per week     Comment: weekends       Review of Systems  As per HPI. Remainder of 10 systems reviewed, negative.        Objective:     This began as a video visit but due to technical difficulty we changed to a telephone visit.  During video portion, he was well appearing, in no acute distress.  He spoke in full sentences, no increased work of breathing  Normal mood and affect.  No SI or HI.     Laboratory Data  Reviewed in Epic today, using Synopsis and Chart Review filters.    Lab Results   Component Value Date    CREATININE 0.96 04/30/2023    QFTTBGOLD Negative 03/12/2019    HEPCAB Reactive (A) 03/12/2019    HCVRNA Not Detected 03/27/2022    CHOL 169 03/12/2019    HDL 26 (L) 03/12/2019    LDL 123 (H) 03/12/2019    NONHDL 143 03/12/2019    TRIG 100 03/12/2019    A1C 5.0 03/12/2019    FINALDX  04/06/2020     A: Soft tissue, left neck, excision  - Epidermal cyst, pilar type    This electronic signature is attestation that the pathologist personally reviewed the submitted material(s) and the final diagnosis reflects that evaluation.

## 2023-05-28 NOTE — Unmapped (Signed)
[facility] Checklist     Type of visit:  video    Are you located in [place]? yes    Reason for visit: follow-up    Questions / Concerns that need to be addressed: No    General Consent to Treat (GCT) for epic video visits only: Verbal consent    HCDM reviewed and updated in Epic:    We are working to make sure all of our patients??? wishes are updated in Epic and part of that is documenting a Environmental health practitioner for each patient  A Health Care Decision Maker is someone you choose who can make health care decisions for you if you are not able - who would you most want to do this for you????  is already up to date.    HCDM (patient stated preference): Kevin [name],Kevin [name] - Father - [phone]    COVID-19 Vaccine Summary  Which COVID-19 Vaccine was administered  Moderna  Type:  Dates Given:                   If no: Are you interested in scheduling? Wants vaccine - eligible later

## 2023-05-31 NOTE — Unmapped (Signed)
[facility] Refill Coordination Note    Specialty Medication(s) to be Shipped:   Infectious Disease: Biktarvy    Other medication(s) to be shipped: No additional medications requested for fill at this time     Kevin [name], DOB: 1990/12/06  Phone: [phone] (home)       All above HIPAA information was verified with patient's family member, father.     Was a Nurse, learning disability used for this call? No    Completed refill call assessment today to schedule patient's medication shipment from the [facility] [phone]).  All relevant notes have been reviewed.     Specialty medication(s) and dose(s) confirmed: Regimen is correct and unchanged.   Changes to medications: Lorance reports no changes at this time.  Changes to insurance: No  New side effects reported not previously addressed with a pharmacist or physician: None reported  Questions for the pharmacist: No    Confirmed patient received a Conservation officer, historic buildings and a Surveyor, mining with first shipment. The patient will receive a drug information handout for each medication shipped and additional FDA Medication Guides as required.       DISEASE/MEDICATION-SPECIFIC INFORMATION        N/A    SPECIALTY MEDICATION ADHERENCE     Medication Adherence    Patient reported X missed doses in the last month: 0  Specialty Medication: BIKTARVY 50-200-25 mg tablet (bictegrav-emtricit-tenofov ala)  Patient is on additional specialty medications: No  Patient is on more than two specialty medications: No  Any gaps in refill history greater than 2 weeks in the last 3 months: no  Demonstrates understanding of importance of adherence: yes  Informant: patient  Confirmed plan for next specialty medication refill: delivery by pharmacy  Refills needed for supportive medications: not needed          Refill Coordination    Has the Patients' Contact Information Changed: No  Is the Shipping Address Different: No         Were doses missed due to medication being on hold? No    BIKTARVY 50-200-25   mg: 7 days of medicine on hand     REFERRAL TO PHARMACIST     Referral to the pharmacist: Not needed      [facility]     Shipping address confirmed in Epic.       Delivery Scheduled: Yes, Expected medication delivery date: 06/05/2023.     Medication will be delivered via Next Day Courier to the prescription address in Epic WAM.    [name]   [facility] Specialty Technician

## 2023-06-05 MED FILL — BIKTARVY 50 MG-200 MG-25 MG TABLET: ORAL | 30 days supply | Qty: 30 | Fill #1

## 2023-06-11 ENCOUNTER — Telehealth: Admit: 2023-06-11 | Discharge: 2023-06-12 | Payer: PRIVATE HEALTH INSURANCE

## 2023-06-11 DIAGNOSIS — F32A Depression, unspecified depression type: Principal | ICD-10-CM

## 2023-06-11 DIAGNOSIS — F419 Anxiety disorder, unspecified: Principal | ICD-10-CM

## 2023-06-11 MED ORDER — VENLAFAXINE 37.5 MG TABLET
ORAL_TABLET | 2 refills | 0 days | Status: CP
Start: 2023-06-11 — End: ?
  Filled 2023-07-27: qty 30, 22d supply, fill #0

## 2023-06-11 NOTE — Unmapped (Signed)
[facility] Checklist     Type of visit:  video    Are you located in [place]? yes    Reason for visit: follow up    Questions / Concerns that need to be addressed: patient feels medication making him feel more sad.    General Consent to Treat (GCT) for epic video visits only: Verbal consent    HCDM reviewed and updated in Epic:    We are working to make sure all of our patients??? wishes are updated in Epic and part of that is documenting a Environmental health practitioner for each patient  A Health Care Decision Maker is someone you choose who can make health care decisions for you if you are not able - who would you most want to do this for you????  is already up to date.    HCDM (patient stated preference): Isley,lee - Father - [phone]    COVID-19 Vaccine Summary  Which COVID-19 Vaccine was administered  Moderna  Type:  Dates Given:                   If no: Are you interested in scheduling? Declines vaccine

## 2023-06-11 NOTE — Unmapped (Signed)
Assessment/Plan:      Kevin [name], a 32 y.o. male seen today for  follow-up evaluation of depression/anxiety.     Plan:    Depression, unspecified depression type  Anxiety  - History of depression as well as possible PTSD. Recently started on escitalopram- at 10mg  he did not notice any change in symptoms, but since increasing to 15mg  he reports worsening of depressive symptoms. He notes passive SI but no plan or intent.  - Will taper off of Escitalopram. He will take 10mg  for the next 4 days, then 5mg  for 4 days then discontinue completely.   - After stopping escitalopram, start Effexor 37.5mg  x 7 days then increase to 75mg  if tolerating.  - He has an initial visit with a therapist scheduled for 06/25/23.  - Will send crisis resources should he have any worsening symptoms.    HIV  Doing well on Biktarvy  Fills ART via private insurance.   Lab Results   Component Value Date    ACD4 840 04/30/2023    HIVCP <20 (H) 03/27/2022    HIVRS Not Detected 04/30/2023     Continue current therapy.  No labs today.  Encouraged continued excellent ARV adherence.    Immunization History   Administered Date(s) Administered    COVID-19 VACCINE,MRNA(MODERNA)(PF) 08/03/2020     Disposition  Return to clinic 1-2 weeks or sooner if needed.    Lahoma Rocker, PA-C  [facility]   [address]  [place], [place].  [address]  Phone: [phone]   Fax: [phone]       The patient reports they are physically located in [place] and is currently: at home. I conducted a audio/video visit. I spent  68m 15s on the video call with the patient. I spent an additional 5 minutes on pre- and post-visit activities on the date of service .     Subjective:      Chief Complaint   Depression  Anxiety    HPI  Return visit for Kevin [name], a 32 y.o. male presenting for follow-up of depression/anxiety.    Visit 05/28/23  Today, he reports he started escitalopram around 2 weeks ago. He's taking 1 pill (10mg ), but hasn't noticed any change in symptoms. He's also not experiencing any side effects from the medication- no nausea, diarrhea, sexual side effects, drowsiness, headache, insomnia.     He recently has been busy with some vacations, so hasn't been able to set up a therapy appointment, but plans to do so.     He noticed recently he is anxious about a number of things- he's worried his parents might get sick or something bad will happen to them, despite them being in their early 65s and healthy, or that his brother might get hurt. He finds that he can't enjoy down time because if he's home trying to relax he will go into dark places and feel anxious about things or feel bad about himself.      He's felt this way since he was in high school, but hasn't ever felt ready to discuss it further with someone or take medication. He now wants to address these things so he can live a healthy life and be happy.      He also discussed the role of mja in his life- in the past it was a fun, social thing to do, but now he finds he smokes daily just out of habit or because he is worried how he'll feel  if he doesn't smoke. At the same time, he notes that he'll get more anxious after smoking, so it isn't enjoyable any more. He would like to decrease use, although finds it difficult as everyone around him smokes. He used to also find similar response with alcohol, but feels alcohol use is less of an issue now- he mainly drinks socially on the weekends.     He focuses on the things he *isn't* doing instead of the things he is doing well for himself and his health. He's trying to reframe some of these thoughts to focus on the good things in his life. He wants to be able to be a good partner, and feels it will be hard to do so unless he addresses some of these things in his life.     06/11/23  - [name] increased his dosage of escitalopram to 15mg  after our last visit on 7/8. At the 10mg  dose he didn't note any change in mood, but since increasing to 15mg  he feels his mood has worsened. He notes feeling especially down and that he's felt more depressed. When asked about SI, he states that he occasionally will have passive SI but would never make any plan or attempt. He has a lot of things he wants to do with his life and wants to address his depression so he can achieve those things.  - He scheduled an appointment with a local therapist for August 5!      Past Medical History:   Diagnosis Date    Anal fistula     History of COVID-19 2021    History of syphilis     HIV disease (CMS-HCC)      Medications and Allergies   Reviewed and updated today. See bottom of this visit's encounter summary for details.  Current Outpatient Medications on File Prior to Visit   Medication Sig    bictegrav-emtricit-tenofov ala (BIKTARVY) 50-200-25 mg tablet Take 1 tablet by mouth daily.    escitalopram oxalate (LEXAPRO) 10 MG tablet Take 1 tablet (10 mg total) by mouth daily.     No current facility-administered medications on file prior to visit.     Allergies   Allergen Reactions    Doxycycline Other (See Comments)     GI upset     Social History  Social History     Tobacco Use    Smoking status: Former     Current packs/day: 0.00     Types: Cigarettes     Quit date: 2020     Years since quitting: 4.5    Smokeless tobacco: Never   Substance Use Topics    Alcohol use: Yes     Alcohol/week: 8.0 standard drinks of alcohol     Types: 8 Shots of liquor per week     Comment: weekends       Review of Systems  As per HPI. Remainder of 10 systems reviewed, negative.        Objective:        Physical Exam  Vitals reviewed.   Constitutional:       Appearance: Normal appearance. He is not ill-appearing.   Pulmonary:      Effort: Pulmonary effort is normal.   Neurological:      Mental Status: He is alert.   Psychiatric:         Mood and Affect: Mood normal.         Speech: Speech normal.  Behavior: Behavior normal. Behavior is cooperative.         Thought Content: Thought content normal. Thought content does not include homicidal or suicidal ideation. Thought content does not include homicidal or suicidal plan.         Cognition and Memory: Cognition and memory normal.         Judgment: Judgment normal.       Laboratory Data  Reviewed in Epic today, using Synopsis and Chart Review filters.    Lab Results   Component Value Date    CREATININE 0.96 04/30/2023    QFTTBGOLD Negative 03/12/2019    HEPCAB Reactive (A) 03/12/2019    HCVRNA Not Detected 03/27/2022    CHOL 169 03/12/2019    HDL 26 (L) 03/12/2019    LDL 123 (H) 03/12/2019    NONHDL 143 03/12/2019    TRIG 100 03/12/2019    A1C 5.0 03/12/2019    FINALDX  04/06/2020     A: Soft tissue, left neck, excision  - Epidermal cyst, pilar type    This electronic signature is attestation that the pathologist personally reviewed the submitted material(s) and the final diagnosis reflects that evaluation.

## 2023-06-26 ENCOUNTER — Ambulatory Visit: Admit: 2023-06-26 | Payer: PRIVATE HEALTH INSURANCE

## 2023-06-26 NOTE — Unmapped (Unsigned)
Called patient x2, no answer [phone]). Left VM. Called home [phone])

## 2023-07-02 NOTE — Unmapped (Signed)
[facility] Refill Coordination Note    Specialty Medication(s) to be Shipped:   Infectious Disease: Biktarvy    Other medication(s) to be shipped: No additional medications requested for fill at this time     Kevin [name], DOB: 1991/01/15  Phone: [phone] (home)       All above HIPAA information was verified with patient's family member, Kevin [name].     Was a Nurse, learning disability used for this call? No    Completed refill call assessment today to schedule patient's medication shipment from the [facility] [phone]).  All relevant notes have been reviewed.     Specialty medication(s) and dose(s) confirmed: Regimen is correct and unchanged.   Changes to medications: Kevin [name] reports no changes at this time.  Changes to insurance: No  New side effects reported not previously addressed with a pharmacist or physician: None reported  Questions for the pharmacist: No    Confirmed patient received a Conservation officer, historic buildings and a Surveyor, mining with first shipment. The patient will receive a drug information handout for each medication shipped and additional FDA Medication Guides as required.       DISEASE/MEDICATION-SPECIFIC INFORMATION        N/A    SPECIALTY MEDICATION ADHERENCE     Medication Adherence    Patient reported X missed doses in the last month: 0  Specialty Medication: BIKTARVY 50-200-25 mg tablet (bictegrav-emtricit-tenofov ala)  Patient is on additional specialty medications: No              Were doses missed due to medication being on hold? No    Biktarvy 50-200-25  : 8 days of medicine on hand       REFERRAL TO PHARMACIST     Referral to the pharmacist: Not needed      [facility]     Shipping address confirmed in Epic.       Delivery Scheduled: Yes, Expected medication delivery date: 07/06/2023.     Medication will be delivered via Next Day Courier to the prescription address in Epic WAM.    Thad Ranger, PharmD   [facility] Specialty Pharmacist

## 2023-07-05 MED FILL — BIKTARVY 50 MG-200 MG-25 MG TABLET: ORAL | 30 days supply | Qty: 30 | Fill #2

## 2023-07-06 NOTE — Unmapped (Signed)
Patient needs to reschedule missed appt with Eric

## 2023-07-19 NOTE — Unmapped (Unsigned)
[place] [facility] Checklist     Type of visit:  video    Are you located in [place]? yes    Reason for visit: follow up    Questions / Concerns that need to be addressed:     General Consent to Treat (GCT) for epic video visits only: {IMC AVW:09811}    HCDM reviewed and updated in Epic:    We are working to make sure all of our patients??? wishes are updated in Epic and part of that is documenting a Environmental health practitioner for each patient  A Health Care Decision Maker is someone you choose who can make health care decisions for you if you are not able - who would you most want to do this for you????  {Updated:56707}    HCDM (patient stated preference): Kevin [name],Kevin [name] - Father - [phone]    COVID-19 Vaccine Summary  Which COVID-19 Vaccine was administered  Moderna  Type:  Dates Given:                   If no: Are you interested in scheduling? {COVIDVACCINEINTENT:75248}

## 2023-07-26 NOTE — Unmapped (Signed)
Kj called the nurse line asking for the number to shared services.   Returned call, no answer. Left number on voicemail.

## 2023-08-06 ENCOUNTER — Telehealth: Admit: 2023-08-06 | Discharge: 2023-08-07 | Payer: PRIVATE HEALTH INSURANCE

## 2023-08-06 DIAGNOSIS — F32A Depression, unspecified depression type: Principal | ICD-10-CM

## 2023-08-06 DIAGNOSIS — F419 Anxiety disorder, unspecified: Principal | ICD-10-CM

## 2023-08-06 DIAGNOSIS — B20 Human immunodeficiency virus [HIV] disease: Principal | ICD-10-CM

## 2023-08-06 MED ORDER — VENLAFAXINE 75 MG TABLET
ORAL_TABLET | Freq: Every day | ORAL | 2 refills | 30 days | Status: CP
Start: 2023-08-06 — End: ?
  Filled 2023-08-14: qty 30, 30d supply, fill #0

## 2023-08-06 NOTE — Unmapped (Signed)
[place] [facility] Checklist     Type of visit:  video    Are you located in [place]? yes    Reason for visit: follow up    Questions / Concerns that need to be addressed: no concerns    General Consent to Treat (GCT) for epic video visits only: Verbal consent    HCDM reviewed and updated in Epic:    We are working to make sure all of our patients??? wishes are updated in Epic and part of that is documenting a Environmental health practitioner for each patient  A Health Care Decision Maker is someone you choose who can make health care decisions for you if you are not able - who would you most want to do this for you????  is already up to date.    HCDM (patient stated preference): Vanderhoof,lee - Father - [phone]    COVID-19 Vaccine Summary  Which COVID-19 Vaccine was administered  Moderna  Type:  Dates Given:                   If no: Are you interested in scheduling? Declines vaccine

## 2023-08-06 NOTE — Unmapped (Signed)
Assessment/Plan:      Kevin [name], a 32 y.o. male seen today for evaluation of depression and anxiety.    Plan:    Anxiety  Depression, unspecified depression type  - History of depression and anxiety. Previously on Lexapro but experienced worsening depression so was changed at last visit to venlafaxine 37.5mg . He's been on this for ~10 days and reports feeling less anxious overall. He denies any side effects.  - Increase to venlafaxine 75mg  daily.  - Saw a therapist for 1 session but wants to look for alternative options. I emailed him with recommendations from [employer] based on his preferences.  - Follow-up in 3 months, or sooner as needed.  -     venlafaxine (EFFEXOR) 75 MG tablet; Take 1 tablet (75 mg total) by mouth daily.    HIV  Doing well on Biktarvy  Fills ART via private insurance.   Lab Results   Component Value Date    ACD4 840 04/30/2023    HIVCP <20 (H) 03/27/2022    HIVRS Not Detected 04/30/2023     Continue current therapy.  No labs today.  Encouraged continued excellent ARV adherence.      Disposition  Return to clinic 3-4 months or sooner if needed.    Lahoma Rocker, PA-C  [facility]   [address]  [place], [place].  [address]  Phone: [phone]   Fax: [phone]     The patient reports they are physically located in [place] and is currently: at home. I conducted a phone visit.  I spent 15 minutes on the phone call with the patient on the date of service .            Subjective:      Chief Complaint   Depression  Anxiety    HPI  Urgent visit for Kevin [name], a 32 y.o. male presenting for the above concerns.    Visit 05/28/23  Today, he reports he started escitalopram around 2 weeks ago. He's taking 1 pill (10mg ), but hasn't noticed any change in symptoms. He's also not experiencing any side effects from the medication- no nausea, diarrhea, sexual side effects, drowsiness, headache, insomnia.     He recently has been busy with some vacations, so hasn't been able to set up a therapy appointment, but plans to do so.     He noticed recently he is anxious about a number of things- he's worried his parents might get sick or something bad will happen to them, despite them being in their early 43s and healthy, or that his brother might get hurt. He finds that he can't enjoy down time because if he's home trying to relax he will go into dark places and feel anxious about things or feel bad about himself.      He's felt this way since he was in high school, but hasn't ever felt ready to discuss it further with someone or take medication. He now wants to address these things so he can live a healthy life and be happy.      He also discussed the role of mja in his life- in the past it was a fun, social thing to do, but now he finds he smokes daily just out of habit or because he is worried how he'll feel if he doesn't smoke. At the same time, he notes that he'll get more anxious after smoking, so it isn't enjoyable any more. He would like to decrease  use, although finds it difficult as everyone around him smokes. He used to also find similar response with alcohol, but feels alcohol use is less of an issue now- he mainly drinks socially on the weekends.     He focuses on the things he *isn't* doing instead of the things he is doing well for himself and his health. He's trying to reframe some of these thoughts to focus on the good things in his life. He wants to be able to be a good partner, and feels it will be hard to do so unless he addresses some of these things in his life.      06/11/23  - Kevin [name] increased his dosage of escitalopram to 15mg  after our last visit on 7/8. At the 10mg  dose he didn't note any change in mood, but since increasing to 15mg  he feels his mood has worsened. He notes feeling especially down and that he's felt more depressed. When asked about SI, he states that he occasionally will have passive SI but would never make any plan or attempt. He has a lot of things he wants to do with his life and wants to address his depression so he can achieve those things.  - He scheduled an appointment with a local therapist for August 5!      08/06/23  - Kevin [name] tapered off of Lexapro and started venlafaxine approximately 10 days ago. He feels much better than he did on Lexapro. 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 He has been sleeping well (sleep usually not an issue for him), but notices he has not been worrying about random things as much as in the past. He overall feels more stable in his emotions.  - He saw a therapist but didn't feel they clicked so is going to look for another one. Interested in someone that is familiar with LGBTQ issues in the [facility].    Past Medical History:   Diagnosis Date    Anal fistula     History of COVID-19 2021    History of syphilis     HIV disease (CMS-HCC)        Medications and Allergies   Reviewed and updated today. See bottom of this visit's encounter summary for details.  Current Outpatient Medications on File Prior to Visit   Medication Sig    bictegrav-emtricit-tenofov ala (BIKTARVY) 50-200-25 mg tablet Take 1 tablet by mouth daily.     No current facility-administered medications on file prior to visit.       Allergies   Allergen Reactions    Doxycycline Other (See Comments)     GI upset       Social History  Social History     Tobacco Use    Smoking status: Former     Current packs/day: 0.00     Types: Cigarettes     Quit date: 2020     Years since quitting: 4.7    Smokeless tobacco: Never   Substance Use Topics    Alcohol use: Yes     Alcohol/week: 8.0 standard drinks of alcohol     Types: 8 Shots of liquor per week     Comment: weekends       Review of Systems  As per HPI. Remainder of 10 systems reviewed, negative.        Objective:  No PE as this was a phone visit.     Laboratory Data  Reviewed in Epic today, using Synopsis and Chart Review filters.    Lab Results   Component Value Date    CREATININE 0.96 04/30/2023    QFTTBGOLD Negative 03/12/2019    HEPCAB Reactive (A) 03/12/2019    HCVRNA Not Detected 03/27/2022    CHOL 169 03/12/2019    HDL 26 (L) 03/12/2019    LDL 123 (H) 03/12/2019    NONHDL 143 03/12/2019    TRIG 100 03/12/2019    A1C 5.0 03/12/2019    FINALDX  04/06/2020     A: Soft tissue, left neck, excision  - Epidermal cyst, pilar type    This electronic signature is attestation that the pathologist personally reviewed the submitted material(s) and the final diagnosis reflects that evaluation.

## 2023-08-08 NOTE — Unmapped (Signed)
[facility] Pharm[facility] Refill Coordination Note    Specialty Medication(s) to be Shipped:   Infectious Disease: Biktarvy    Other medication(s) to be shipped:  venlafaxine     Kevin [name], DOB: 1991-10-04  Phone: [phone] (home)       All above HIPAA information was verified with patient's family member, father.     Was a Nurse, learning disability used for this call? No    Completed refill call assessment today to schedule patient's medication shipment from the [facility]  [phone]).  All relevant notes have been reviewed.     Specialty medication(s) and dose(s) confirmed: Regimen is correct and unchanged.   Changes to medications: Destin reports no changes at this time.  Changes to insurance: No  New side effects reported not previously addressed with a pharmacist or physician: None reported  Questions for the pharmacist: No    Confirmed patient received a Conservation officer, historic buildings and a Surveyor, mining with first shipment. The patient will receive a drug information handout for each medication shipped and additional FDA Medication Guides as required.       DISEASE/MEDICATION-SPECIFIC INFORMATION        N/A    SPECIALTY MEDICATION ADHERENCE     Medication Adherence    Patient reported X missed doses in the last month: 0  Specialty Medication: BIKTARVY 50-200-25 mg tablet (bictegrav-emtricit-tenofov ala)  Patient is on additional specialty medications: No  Patient is on more than two specialty medications: No  Any gaps in refill history greater than 2 weeks in the last 3 months: no  Demonstrates understanding of importance of adherence: yes  Informant: father  Confirmed plan for next specialty medication refill: delivery by pharmacy  Refills needed for supportive medications: not needed          Refill Coordination    Has the Patients' Contact Information Changed: No  Is the Shipping Address Different: No         Were doses missed due to medication being on hold? No    BIKTARVY 50-200-25   mg: 7 days of medicine on hand     REFERRAL TO PHARMACIST     Referral to the pharmacist: Not needed      [facility]     Shipping address confirmed in Epic.       Delivery Scheduled: Yes, Expected medication delivery date: 08/15/23.     Medication will be delivered via Next Day Courier to the prescription address in Epic WAM.    [name]   [facility]  Specialty Technician

## 2023-08-14 MED FILL — BIKTARVY 50 MG-200 MG-25 MG TABLET: ORAL | 30 days supply | Qty: 30 | Fill #3

## 2023-09-07 NOTE — Unmapped (Signed)
[facility] Delivery Pharm[facility] Refill Coordination Note    Specialty Medication(s) to be Shipped:   Infectious Disease: Biktarvy    Other medication(s) to be shipped:  venlafaxine     Kevin [name], DOB: 07/25/1991  Phone: [phone] (home)       All above HIPAA information was verified with patient's family member, father.     Was a Nurse, learning disability used for this call? No    Completed refill call assessment today to schedule patient's medication shipment from the [facility]  [phone]).  All relevant notes have been reviewed.     Specialty medication(s) and dose(s) confirmed: Regimen is correct and unchanged.   Changes to medications: Dianna reports no changes at this time.  Changes to insurance: No  New side effects reported not previously addressed with a pharmacist or physician: None reported  Questions for the pharmacist: No    Confirmed patient received a Conservation officer, historic buildings and a Surveyor, mining with first shipment. The patient will receive a drug information handout for each medication shipped and additional FDA Medication Guides as required.       DISEASE/MEDICATION-SPECIFIC INFORMATION        N/A    SPECIALTY MEDICATION ADHERENCE     Medication Adherence    Patient reported X missed doses in the last month: 0  Specialty Medication: BIKTARVY 50-200-25 mg tablet (bictegrav-emtricit-tenofov ala)  Patient is on additional specialty medications: No  Patient is on more than two specialty medications: No  Any gaps in refill history greater than 2 weeks in the last 3 months: no  Demonstrates understanding of importance of adherence: yes  Informant: patient  Confirmed plan for next specialty medication refill: delivery by pharmacy  Refills needed for supportive medications: not needed          Refill Coordination    Has the Patients' Contact Information Changed: No  Is the Shipping Address Different: No         Were doses missed due to medication being on hold? No    BIKTARVY 50-200-25   mg: 7 days of medicine on hand       REFERRAL TO PHARMACIST     Referral to the pharmacist: Not needed      [facility]     Shipping address confirmed in Epic.       Delivery Scheduled: Yes, Expected medication delivery date: 09/12/23.     Medication will be delivered via Next Day Courier to the prescription address in Epic WAM.    [name]   [facility]  Specialty Technician

## 2023-09-11 MED FILL — BIKTARVY 50 MG-200 MG-25 MG TABLET: ORAL | 30 days supply | Qty: 30 | Fill #4

## 2023-09-11 MED FILL — VENLAFAXINE 75 MG TABLET: ORAL | 30 days supply | Qty: 30 | Fill #1

## 2023-10-05 NOTE — Unmapped (Signed)
[facility] Refill Coordination Note    Specialty Medication(s) to be Shipped:   Infectious Disease: Biktarvy    Other medication(s) to be shipped:  venlafaxine     Kevin [name], DOB: [birth year]  Phone: [phone] (home)       All above HIPAA information was verified with patient.     Was a Nurse, learning disability used for this call? No    Completed refill call assessment today to schedule patient's medication shipment from the [facility]  [phone]).  All relevant notes have been reviewed.     Specialty medication(s) and dose(s) confirmed: Regimen is correct and unchanged.   Changes to medications: Keishon reports no changes at this time.  Changes to insurance: No  New side effects reported not previously addressed with a pharmacist or physician: None reported  Questions for the pharmacist: No    Confirmed patient received a Conservation officer, historic buildings and a Surveyor, mining with first shipment. The patient will receive a drug information handout for each medication shipped and additional FDA Medication Guides as required.       DISEASE/MEDICATION-SPECIFIC INFORMATION        N/A    SPECIALTY MEDICATION ADHERENCE     Medication Adherence    Patient reported X missed doses in the last month: 0  Specialty Medication: BIKTARVY 50-200-25 mg tablet (bictegrav-emtricit-tenofov ala)  Patient is on additional specialty medications: No  Patient is on more than two specialty medications: No  Any gaps in refill history greater than 2 weeks in the last 3 months: no  Demonstrates understanding of importance of adherence: yes  Informant: father  Confirmed plan for next specialty medication refill: delivery by pharmacy  Refills needed for supportive medications: not needed          Refill Coordination    Has the Patients' Contact Information Changed: No  Is the Shipping Address Different: No         Were doses missed due to medication being on hold? No    BIKTARVY 50-200-25   mg: 7 days of medicine on hand       REFERRAL TO PHARMACIST     Referral to the pharmacist: Not needed      [facility]     Shipping address confirmed in Epic.       Delivery Scheduled: Yes, Expected medication delivery date: 10/11/23.     Medication will be delivered via Next Day Courier to the prescription address in Epic WAM.    [name]   [facility]  Specialty Technician

## 2023-10-10 MED FILL — BIKTARVY 50 MG-200 MG-25 MG TABLET: ORAL | 30 days supply | Qty: 30 | Fill #5

## 2023-10-10 MED FILL — VENLAFAXINE 75 MG TABLET: ORAL | 30 days supply | Qty: 30 | Fill #2

## 2023-10-31 NOTE — Unmapped (Signed)
Called and left voicemail for RW renewal.    Kevin [name]  Benefits Counselor  Time of Intervention: 5 mins

## 2023-11-07 DIAGNOSIS — B20 Human immunodeficiency virus [HIV] disease: Principal | ICD-10-CM

## 2023-11-07 MED ORDER — VENLAFAXINE 75 MG TABLET
ORAL_TABLET | Freq: Every day | ORAL | 2 refills | 30.00 days
Start: 2023-11-07 — End: ?

## 2023-11-07 MED ORDER — BIKTARVY 50 MG-200 MG-25 MG TABLET
ORAL_TABLET | Freq: Every day | ORAL | 5 refills | 30.00 days
Start: 2023-11-07 — End: ?

## 2023-11-08 MED ORDER — BIKTARVY 50 MG-200 MG-25 MG TABLET
ORAL_TABLET | Freq: Every day | ORAL | 0 refills | 30.00 days | Status: CP
Start: 2023-11-08 — End: ?
  Filled 2023-11-12: qty 30, 30d supply, fill #0

## 2023-11-08 MED ORDER — VENLAFAXINE 75 MG TABLET
ORAL_TABLET | Freq: Every day | ORAL | 2 refills | 30.00 days | Status: CP
Start: 2023-11-08 — End: ?
  Filled 2023-11-12: qty 30, 30d supply, fill #0

## 2023-11-08 NOTE — Unmapped (Signed)
Medication Requested: bictegrav-emtricit-tenofov       Future Appointments   Date Time Provider Department Center   12/04/2023  4:00 PM Langhans, Roque Lias, PA [facility]     Per Provider Note: Current regimen: Biktarvy     Standing order protocol requirements met?: Yes    Sent to: Pharmacy per protocol    Days Supply Given: 30 days  Number of Refills: 0

## 2023-11-08 NOTE — Unmapped (Signed)
[facility] Refill Coordination Note    Specialty Medication(s) to be Shipped:   Infectious Disease: Biktarvy    Other medication(s) to be shipped:  venlafaxine     Kevin [name], DOB: [birth year]  Phone: [phone] (home)       All above HIPAA information was verified with patient's family member, father.     Was a Nurse, learning disability used for this call? No    Completed refill call assessment today to schedule patient's medication shipment from the [facility] Delivery Pharm[facility]  [phone]).  All relevant notes have been reviewed.     Specialty medication(s) and dose(s) confirmed: Regimen is correct and unchanged.   Changes to medications: Iden reports no changes at this time.  Changes to insurance: No  New side effects reported not previously addressed with a pharmacist or physician: None reported  Questions for the pharmacist: No    Confirmed patient received a Conservation officer, historic buildings and a Surveyor, mining with first shipment. The patient will receive a drug information handout for each medication shipped and additional FDA Medication Guides as required.       DISEASE/MEDICATION-SPECIFIC INFORMATION        N/A    SPECIALTY MEDICATION ADHERENCE     Medication Adherence    Patient reported X missed doses in the last month: 0  Specialty Medication: BIKTARVY 50-200-25 mg tablet (bictegrav-emtricit-tenofov ala)  Patient is on additional specialty medications: No  Patient is on more than two specialty medications: No  Any gaps in refill history greater than 2 weeks in the last 3 months: no  Demonstrates understanding of importance of adherence: yes  Informant: patient  Confirmed plan for next specialty medication refill: delivery by pharmacy  Refills needed for supportive medications: not needed          Refill Coordination    Has the Patients' Contact Information Changed: No  Is the Shipping Address Different: No         Were doses missed due to medication being on hold? No    BIKTARVY 50-200-25   mg: 6 days of medicine on hand       REFERRAL TO PHARMACIST     Referral to the pharmacist: Not needed      [facility]     Shipping address confirmed in Epic.       Delivery Scheduled: Yes, Expected medication delivery date: 11/13/23.  However, Rx request for refills was sent to the provider as there are none remaining.     Medication will be delivered via Next Day Courier to the prescription address in Epic WAM.    [name]   [facility]  Specialty Technician

## 2023-12-04 ENCOUNTER — Ambulatory Visit: Admit: 2023-12-04 | Discharge: 2023-12-05 | Payer: BLUE CROSS/BLUE SHIELD

## 2023-12-04 DIAGNOSIS — Z13220 Encounter for screening for lipoid disorders: Principal | ICD-10-CM

## 2023-12-04 DIAGNOSIS — B20 Human immunodeficiency virus [HIV] disease: Principal | ICD-10-CM

## 2023-12-04 DIAGNOSIS — Z9189 Other specified personal risk factors, not elsewhere classified: Principal | ICD-10-CM

## 2023-12-04 DIAGNOSIS — Z79899 Other long term (current) drug therapy: Principal | ICD-10-CM

## 2023-12-04 DIAGNOSIS — Z113 Encounter for screening for infections with a predominantly sexual mode of transmission: Principal | ICD-10-CM

## 2023-12-04 DIAGNOSIS — Z5181 Encounter for therapeutic drug level monitoring: Principal | ICD-10-CM

## 2023-12-04 DIAGNOSIS — Z6830 Body mass index (BMI) 30.0-30.9, adult: Principal | ICD-10-CM

## 2023-12-04 LAB — BASIC METABOLIC PANEL
ANION GAP: 12 mmol/L (ref 5–14)
BLOOD UREA NITROGEN: 8 mg/dL — ABNORMAL LOW (ref 9–23)
BUN / CREAT RATIO: 8
CALCIUM: 9.6 mg/dL (ref 8.7–10.4)
CHLORIDE: 105 mmol/L (ref 98–107)
CO2: 21.9 mmol/L (ref 20.0–31.0)
CREATININE: 0.97 mg/dL (ref 0.73–1.18)
EGFR CKD-EPI (2021) MALE: >90 mL/min/{1.73_m2} (ref >=60)
GLUCOSE RANDOM: 80 mg/dL (ref 70–179)
POTASSIUM: 3.8 mmol/L (ref 3.4–4.8)
SODIUM: 139 mmol/L (ref 135–145)

## 2023-12-04 LAB — CBC W/ AUTO DIFF
BASOPHILS ABSOLUTE COUNT: 0.1 10*9/L (ref 0.0–0.1)
BASOPHILS RELATIVE PERCENT: 0.7 %
EOSINOPHILS ABSOLUTE COUNT: 0.1 10*9/L (ref 0.0–0.5)
EOSINOPHILS RELATIVE PERCENT: 1.5 %
HEMATOCRIT: 46.2 % (ref 39.0–48.0)
HEMOGLOBIN: 16.2 g/dL (ref 12.9–16.5)
LYMPHOCYTES ABSOLUTE COUNT: 2.8 10*9/L (ref 1.1–3.6)
LYMPHOCYTES RELATIVE PERCENT: 30.6 %
MEAN CORPUSCULAR HEMOGLOBIN CONC: 35.1 g/dL (ref 32.0–36.0)
MEAN CORPUSCULAR HEMOGLOBIN: 31.5 pg (ref 25.9–32.4)
MEAN CORPUSCULAR VOLUME: 89.7 fL (ref 77.6–95.7)
MEAN PLATELET VOLUME: 9 fL (ref 6.8–10.7)
MONOCYTES ABSOLUTE COUNT: 0.8 10*9/L (ref 0.3–0.8)
MONOCYTES RELATIVE PERCENT: 9 %
NEUTROPHILS ABSOLUTE COUNT: 5.4 10*9/L (ref 1.8–7.8)
NEUTROPHILS RELATIVE PERCENT: 58.2 %
PLATELET COUNT: 240 10*9/L (ref 150–450)
RED BLOOD CELL COUNT: 5.15 10*12/L (ref 4.26–5.60)
RED CELL DISTRIBUTION WIDTH: 12.6 % (ref 12.2–15.2)
WBC ADJUSTED: 9.2 10*9/L (ref 3.6–11.2)

## 2023-12-04 LAB — LIPID PANEL
CHOLESTEROL/HDL RATIO SCREEN: 6.3 — ABNORMAL HIGH (ref 1.0–4.5)
CHOLESTEROL: 209 mg/dL — ABNORMAL HIGH (ref <=200)
HDL CHOLESTEROL: 33 mg/dL — ABNORMAL LOW (ref 40–60)
LDL CHOLESTEROL CALCULATED: 124 mg/dL — ABNORMAL HIGH (ref 40–99)
NON-HDL CHOLESTEROL: 176 mg/dL — ABNORMAL HIGH (ref 70–130)
TRIGLYCERIDES: 260 mg/dL — ABNORMAL HIGH (ref 0–150)
VLDL CHOLESTEROL CAL: 52 mg/dL — ABNORMAL HIGH (ref 10–50)

## 2023-12-04 LAB — BILIRUBIN, TOTAL: BILIRUBIN TOTAL: 0.6 mg/dL (ref 0.3–1.2)

## 2023-12-04 LAB — AST: AST (SGOT): 24 U/L (ref <=34)

## 2023-12-04 LAB — HEMOGLOBIN A1C
ESTIMATED AVERAGE GLUCOSE: 97 mg/dL
HEMOGLOBIN A1C: 5 % (ref 4.8–5.6)

## 2023-12-04 LAB — ALT: ALT (SGPT): 44 U/L (ref 10–49)

## 2023-12-04 MED ORDER — BIKTARVY 50 MG-200 MG-25 MG TABLET
ORAL_TABLET | Freq: Every day | ORAL | 5 refills | 30.00 days | Status: CP
Start: 2023-12-04 — End: ?
  Filled 2023-12-18: qty 30, 30d supply, fill #0

## 2023-12-04 MED ORDER — VENLAFAXINE 75 MG TABLET
ORAL_TABLET | Freq: Every day | ORAL | 5 refills | 30.00 days | Status: CP
Start: 2023-12-04 — End: ?
  Filled 2023-12-18: qty 30, 30d supply, fill #0

## 2023-12-04 MED ORDER — WEGOVY 0.25 MG/0.5 ML SUBCUTANEOUS PEN INJECTOR
SUBCUTANEOUS | 0 refills | 0.00 days | Status: CP
Start: 2023-12-04 — End: ?

## 2023-12-04 NOTE — Unmapped (Unsigned)
[facility]  [address]  [place], [place]  [address]  P [phone]  F [phone]     Primary care provider: Wynona Neat, PA    Assessment/Plan:      HIV (dx'd 02/2019, nadir CD4 241 / 20% in 02/2019)  - chronic, stable   Overall doing well. Current regimen: Biktarvy (BIC/FTC/TAF)  Misses doses of ARVs rarely    Med access through insurance  CD4 count over 300 for >2Y on suppressive ART; no prophylaxis needed; recheck CD4 annually  Discussed ARV adherence    Lab Results   Component Value Date    ACD4 840 04/30/2023    CD4% 40% 04/30/2023    HIVRS Not Detected 04/30/2023     HIV RNA and safety labs  Continue current therapy  Encouraged continued excellent ARV adherence  Weight Gain  BMI 30.0-30.9,adult  - He's had gradual weight gain over the last 5 years, and is interested in comprehensive weight management including diet and exercise program and pharmacotherapy. He will be referred to the ID dietician today.  - Discussed options for pharmacotherapy and he would like to start [facility]. We discussed MOA, potential side effects, contraindications.  - Prescription sent to [facility] ([facility])- may need PA or appeal based on coverage.  Adverse effects: nausea, vomiting, diarrhea, constipation, headache, dyspepsia, increased HR  Cautions/Concerns: gastroparesis, severe renal disease due to vomiting and dehydration, suicidal ideation and behavior, injection site reactions  Contraindications: pregnancy and breastfeeding, personal or family history of medullary thyroid cancer or multiple endocrine neoplasia syndrome (MEN2), pancreatitis, acute gallbladder disease  -     WEGOVY 0.25 MG/0.5 ML SUBCUTANEOUS PEN INJECTOR; Inject 0.25 mg under the skin every seven (7) days.  -     Hemoglobin A1c  -     Ambulatory referral to Infectious Disease; Future  Lab Results   Component Value Date    CHOL 169 03/12/2019    HDL 26 (L) 03/12/2019    LDL 123 (H) 03/12/2019    NONHDL 143 03/12/2019    TRIG 100 03/12/2019     Mood  - History of depression and anxiety. Previously on Lexapro but experienced worsening depression so was changed at last visit to venlafaxine 37.5mg , which was then increased to 75mg  daily.   -He's doing well on this dosage.  - Recently established with a therapist.   -     venlafaxine (EFFEXOR) 75 MG tablet; Take 1 tablet (75 mg total) by mouth daily.    Sexual health & secondary prevention  -   Sexually active with men. Updating STI testing today.  Lab Results   Component Value Date    RPR Nonreactive 04/30/2023    RPR Nonreactive 10/23/2022    CTNAA Negative 04/30/2023    CTNAA Negative 04/30/2023    CTNAA Negative 04/30/2023    GCNAA Negative 04/30/2023    GCNAA Negative 04/30/2023    GCNAA Negative 04/30/2023    SPECSOURCE Rectum 04/30/2023    SPECSOURCE Throat 04/30/2023    SPECSOURCE Urine 04/30/2023       GC/CT NAATs - obtained today from all exposed anatomical site(s)  RPR - for screening obtained today      Health maintenance      Metabolic conditions  Wt Readings from Last 5 Encounters:   12/04/23 85.3 kg (188 lb)   04/30/23 83 kg (183 lb)   11/27/22 77.8 kg (171 lb 8.3 oz)   11/07/22 81.6 kg (179 lb 14.3 oz)  10/23/22 81.6 kg (180 lb)     Lab Results   Component Value Date    CREATININE 0.96 04/30/2023    GLUCOSEU Negative 03/12/2019    GLU 96 04/30/2023    A1C 5.0 03/12/2019    ALT 37 04/30/2023    ALT 40 10/23/2022    ALT 36 03/27/2022     # Kidney health - SCr and eGFR  # Bone health - assessment not yet needed (under age 11)  # Diabetes assessment - check random glucose  check A1c  # NAFLD assessment - suspicion for MAFLD low    Communicable diseases  Lab Results   Component Value Date    QFTTBGOLD Negative 03/12/2019    HEPAIGG Nonreactive 03/12/2019    HEPBSAB Nonreactive 03/27/2022    HEPCAB Reactive (A) 03/12/2019    HCVRNA Not Detected 03/27/2022     # TB screening - no longer needed; negative IGRA, low risk  # Hepatitis screening - meets criteria for annual HCV (re)screening per DHHS & HIVMA      Immunization History   Administered Date(s) Administered    COVID-19 VACCINE,MRNA(MODERNA)(PF) 08/03/2020       Immunizations needed - patient declines recommended immunization(s)      I personally spent 45 minutes face-to-face and non-face-to-face in the care of this patient, which includes all pre, intra, and post visit time on the date of service.      Disposition  Next appointment: 3 months      Lahoma Rocker, PA-C  [facility]   [address]  [place], [place].  [address]  Phone: [phone]   Fax: [phone]      Subjective        HPI  In addition to details in A&P above:    Kevin [name] is a 33 yo man with a history of well controlled HIV, syphilis, anal fistula, BMI 30 here for HIV follow-up.     - He is interested in a plan for weight management. He's noticed steady weight gain over the last few years and difficulty losing weight. It's effecting his self-esteem and mental health. He is interested in meeting with a dietician and also starting medication.  - He tries to eat a balanced diet, although notices that he will snack throughout the day. He has a lot of snacks available at work- usually chips or something salty or sweet. He doesn't usually pack his own snacks or food. Often finds he eats out and usually at a [employer] He usually doesn't eat a lot at a time- he may order a meal but then eat 1/3 now, then 1/3 30 min later then the rest 30 min later.     His mood has been good overall. He feels the effexor is helping.     Past Medical History:   Diagnosis Date    Anal fistula     History of COVID-19 2021    History of syphilis     HIV disease (CMS-HCC)      Social History    Housing - in house in [place].    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    Social History     Socioeconomic History    Marital status: Single   Tobacco Use    Smoking status: Former     Current packs/day: 0.00     Types: Cigarettes     Quit date: 2020  Years since quitting: 5.0    Smokeless tobacco: Never   Vaping Use    Vaping status: Every Day    Substances: Nicotine   Substance and Sexual Activity    Alcohol use: Yes     Alcohol/week: 8.0 standard drinks of alcohol     Types: 8 Shots of liquor per week     Comment: weekends    Drug use: Yes     Frequency: 7.0 times per week     Types: Marijuana     Comment: smokes weed daily    Sexual activity: Yes     Partners: Male     Birth control/protection: Condom     Social Drivers of Health     Physical Activity: Sufficiently Active (03/12/2019)    Exercise Vital Sign     Days of Exercise per Week: 3 days     Minutes of Exercise per Session: 60 min   Stress: Stress Concern Present (03/12/2019)    [employer] of Occupational Health - Occupational Stress Questionnaire     Feeling of Stress : Rather much   Social Connections: Unknown (03/12/2019)    Social Connection and Isolation Panel [NHANES]     Frequency of Communication with Friends and Family: More than three times a week     Frequency of Social Gatherings with Friends and Family: More than three times a week     Attends Religious Services: Never     Database administrator or Organizations: No     Attends Banker Meetings: Never     Medications and Allergies  He has a current medication list which includes the following prescription(s): biktarvy and venlafaxine.    Allergies: Doxycycline    Family History  His family history includes Alzheimer's disease in his maternal grandfather and paternal grandfather; Crohn's disease in his father; Diabetes in his maternal grandmother; Heart failure in his maternal grandmother; Lymphoma in his paternal grandmother.     Objective      BP 104/73 (BP Site: L Arm, BP Position: Sitting, BP Cuff Size: Large)  - Pulse 75  - Temp 36.5 ??C (97.7 ??F) (Temporal)  - Ht 167.6 cm (5' 6)  - Wt 85.3 kg (188 lb)  - BMI 30.34 kg/m??      Physical Exam  Vitals reviewed. Constitutional:       Appearance: Normal appearance.   HENT:      Mouth/Throat:      Mouth: Mucous membranes are moist.      Pharynx: Oropharynx is clear.   Cardiovascular:      Rate and Rhythm: Normal rate and regular rhythm.   Pulmonary:      Effort: Pulmonary effort is normal.      Breath sounds: Normal breath sounds.   Skin:     General: Skin is warm and dry.   Neurological:      Mental Status: He is alert.   Psychiatric:         Mood and Affect: Mood normal.         Behavior: Behavior normal.         Thought Content: Thought content normal.         Judgment: Judgment normal. sensitivity (increases peripheral glucose uptake and utilization)   Co-morbidity benefits: pre-T2DM and T2DM  Dosing (titrate every 1-2 weeks): 500 mg daily > 500 mg twice daily > 1000 mg twice daily   Adverse effects: N/V/D, stomach pain, heartburn, gas, fatigue, weakness, headache   Cautions/Concerns: hold  metformin prior to/after iodinated contrast, HF, renal/hepatic impairment, lactic acidosis, vitamin B12 deficiency   Contraindications: severe renal impairment-eGFR <30, acute/chronic metabolic acidosis including DKA   Additional: ER formulation has lower GI risk potential than IR, GI AEs tend to subside overtime and with slow titration, take with meals, most affordable Topiramate (Topamax??)   MOA: GABA receptor modulation   Co-morbidity benefits: history of migraines or seizures  Dosing: 25 mg daily > may increase in 25-50 mg increments at weekly intervals up to 200 mg/day   Adverse effects: abdominal pain, decreased appetite, N/V/D, sleepiness, dizziness, mood disturbance, taste changes, paresthesia   Cautions/Concerns: concurrent CNS depressant use; concurrent alcohol use; renal impairment-CrCl < 70 mL/min, liver dysfunction, depression, suicidal ideation, kidney stones, hypokalemia, ketogenic diet, seizure disorder, diarrhea, dehydration   Contraindications: pregnancy and breastfeeding; alcohol use within 6 hours, abrupt withdrawal       Bupropion (Wellbutrin XL??)   MOA: reuptake inhibitor of dopamine and norepinephrine  Co-morbidity benefits: smoking cessation, depression, ADHD  Dosing: 150 mg XL every morning, may increase on day 4 to 300 mg XL every morning  Adverse effects: headache, xerostomia, agitation, dizziness, nausea, abdominal pain, diarrhea, anxiety, abnormal dreams, tinnitus, tachycardia/palpitations,   Cautions/Concerns: prior head injury, use in elderly patients, renal/hepatic impairment, diabetes mellitus  Contraindications: pregnancy and breastfeeding, concomitant MAOI use (within 14 days), seizure disorder, bulimia nervosa, anorexia nervosa, abrupt stop of alcohol, benzodiazepine, barbiturate, or anti-seizure (antiepileptic) use  SGLT2Is: Canagliflozin (Invokana??), Dapagliflozin (Farxiga??), Empagliflozin (Jardiance??)  Study Name: SEESAW (Empagliflozin, Jardiance)   MOA: sodium-glucose cotransporter 2 (SGLT2) inhibitor   Co-morbidity benefits: ASCVD event/risk, pre-T2DM and T2DM, CKD (macro-/microalbuminuria), heart failure  Dosing:   Canagliflozin - start 100mg  daily taken before the first meal, in 4-12 weeks may increase to 300mg  daily taken before the first meal  Dapagliflozin - start 5mg  daily in the morning, in 4-12 weeks may increase to 10mg  daily   Empagliflozin - start 10mg  daily in the morning, in 4-12 weeks may increase to 25mg  daily in the morning     Adverse effects: UTI, URI, increased urination, dyslipidemia, increase in hematocrit, arthralgia, nausea, increase in creatinine   Cautions/Concerns: hx of UTI, HLD, alcohol use disorder, eGFR <60, concurrent use with nephrotoxic agents, hypotension, congestive heart failure (CHF) exacerbation, use in elderly patients   Contraindications: pregnancy and breastfeeding; T1DM, DKA, volume depletion, dialysis, eGFR<30  Additional: if having surgery, then hold for 3 days prior to surgery   Other GLP-1 RAs: Semaglutide (Ozempic??)   Study Name: SUSTAIN   MOA: GLP-1 agonist, regulates appetite and caloric intake   Co-morbidity benefits: ASCVD event/risk, pre-T2DM and T2DM, CKD (microalbuminuria)  Dosing subQ once weekly (increase every 4 weeks): 0.25 mg, 0.5 mg, 1 mg, then 2 mg subQ weekly  Adverse effects: N/V/D, constipation, headache, dyspepsia, increased HR  Cautions/Concerns: gastroparesis, severe renal disease due to vomiting and dehydration, injection site reactions  Contraindications: pregnancy and breastfeeding, personal or family history of medullary thyroid cancer or MEN2, pancreatitis, acute gallbladder disease GIP/GLP-1 RA: Tirzepatide Ranae Pila)  Study Name: SURMOUNT  MOA: Dual GIP and GLP-1 receptor agonist, regulates appetite and caloric intake  Co-morbidity benefits: pre-T2DM and T2DM, CKD (macroalbuminuria)  Dosing subQ once weekly (increase every 4 weeks): 2.5, 5 7.5, 10, 12.5, then 15 mg subQ weekly  Adverse effects: N/V/D, abdominal pain, decreased appetite, dyspepsia  Cautions/Concerns: gastroparesis, severe renal disease due to vomiting and dehydration, injection site reactions,   Contraindications: pregnancy and breastfeeding, personal or family history of medullary thyroid cancer or MEN2, pancreatitis,  acute gallbladder disease  Additional: gastric emptying may impact absorption of concomitant administered oral contraception - advise females to switch to non-oral contraceptive method or add barrier for 3 weeks after initiation and for 4 weeks after each dose escalation     References:   AACE (American Association of Clinical Endocrinology) - 2016 Weight-Loss Medications Approved by the FDA for long-term treatment of obesity https://www.aace.com/  AGA [facility] [facility] Association) - 2022 Pharmacological Interventions for Adults with Obesity (machev.com)    Nicotine dependence  - {ID Clinic - MDM 2021 (Optional):73327}  ***  {Referred to tobacco cessation program , Provided [place] Quitline info , Prescribed *** , Revisit readiness at next appt:61195::Revisit readiness at next appt}.      Sexual health & secondary prevention  - {ID Clinic - MDM 2021 (Optional):73327}  {ID Clinic - Relationship Status:61196}.   Parts of body used during sex include: {ID Clinic - Exposed Anatomical Site(s):77679}. {ID Clinic - Anal Sex (Optional):77681}. {ID Clinic - Oral Sex (Optional):77682}. {ID Clinic - Vaginal Sex (Optional):77759}.   {ID Clinic - Recall Window:77683} has had {ID Clinic - Types of ZOX:09604} sex and {has/has not:25512} had add'l STI screening.  He {ID Clinic - Condom Use (Optional):77685}  He {does/does not:200015} routinely discuss HIV status with partner(s).  {ID Clinic - Interest in Having Kids (Optional):60830}.    Lab Results   Component Value Date    RPR Nonreactive 04/30/2023    RPR Nonreactive 10/23/2022    CTNAA Negative 04/30/2023    CTNAA Negative 04/30/2023    CTNAA Negative 04/30/2023    GCNAA Negative 04/30/2023    GCNAA Negative 04/30/2023    GCNAA Negative 04/30/2023    SPECSOURCE Rectum 04/30/2023    SPECSOURCE Throat 04/30/2023    SPECSOURCE Urine 04/30/2023       GC/CT NAATs - {ID Clinic - GC/CT Testing:61198}  RPR - {ID Clinic - Syphilis Testing:61199}      Health maintenance  - {ID Clinic - MDM 2021 (Optional):73327}    Oral health  He {DOES /DOES VWU:98119} have a dentist. Last dental exam ***.    Eye health  He {DOES /DOES JYN:82956} use corrective lenses. Last eye exam ***.    Metabolic conditions  Wt Readings from Last 5 Encounters:   12/04/23 85.3 kg (188 lb)   04/30/23 83 kg (183 lb)   11/27/22 77.8 kg (171 lb 8.3 oz)   11/07/22 81.6 kg (179 lb 14.3 oz)   10/23/22 81.6 kg (180 lb)     Lab Results   Component Value Date    CREATININE 0.96 04/30/2023    GLUCOSEU Negative 03/12/2019    GLU 96 04/30/2023    A1C 5.0 03/12/2019    ALT 37 04/30/2023    ALT 40 10/23/2022    ALT 36 03/27/2022     # Kidney health - {ID Clinic - Kidney Screening (Optional):77698}  # Bone health - {ID Clinic - Bone Screening (Optional):77701}  # Diabetes assessment - {ID Clinic - Diabetes Screening (Optional):77691}  # NAFLD assessment - {ID Clinic - NAFLD Screening (Optional):77716}    Communicable diseases  Lab Results   Component Value Date    QFTTBGOLD Negative 03/12/2019    HEPAIGG Nonreactive 03/12/2019    HEPBSAB Nonreactive 03/27/2022    HEPCAB Reactive (A) 03/12/2019    HCVRNA Not Detected 03/27/2022     # TB screening - {ID Clinic - TB Screening (Optional):77702}  # Hepatitis screening - {ID Clinic - Hepatitis Screening (Optional):77703}  # MMR screening - {  ID Clinic - MMR & VZV Screening (Optional):77704}    Cancer screening  Lab Results   Component Value Date    FINALDX  04/06/2020     A: Soft tissue, left neck, excision  - Epidermal cyst, pilar type    This electronic signature is attestation that the pathologist personally reviewed the submitted material(s) and the final diagnosis reflects that evaluation.       # Cervical - {ID Clinic - Cervical Cancer Screening (Optional):77707}  # Breast - {ID Clinic - Breast Cancer Screening (Optional):77708}    # Anorectal - {ID Clinic - Anorectal Cancer Screening (Optional):77709}  # Colorectal - {ID Clinic - Colorectal Cancer Screening (Optional):77710}  # Liver - {ID Clinic - Liver Cancer Screening (Optional):77718}  # Lung - {ID Clinic - Lung Cancer Screening (Optional):77735}  # Prostate - {ID Clinic - Prostate Cancer Screening (Optional):77738}    Cardiovascular disease  Lab Results   Component Value Date    CHOL 169 03/12/2019    HDL 26 (L) 03/12/2019    LDL 123 (H) 03/12/2019    NONHDL 143 03/12/2019    TRIG 100 03/12/2019     # The ASCVD Risk score (Arnett DK, et al., 2019) failed to calculate.  - {is, is not:60834} taking aspirin   - {is, is not:60834} taking statin  - BP control {DESC; POOR/FAIR/GOOD/EXCELLENT:19665}  - {current, former, never:62671} smoker  # AAA screening - {ID Clinic - AAA Screening (Optional):77753}    Immunization History   Administered Date(s) Administered    COVID-19 VACCINE,MRNA(MODERNA)(PF) 08/03/2020       Immunizations needed - {ID Clinic - Immunizations (Optional):77741}      I personally spent *** minutes face-to-face and non-face-to-face in the care of this patient, which includes all pre, intra, and post visit time on the date of service.      Disposition  Next appointment: {ID Clinic - Return to Clinic (Optional):77746}      To do @ next RTC  ***          Subjective        HPI  In addition to details in A&P above:  ***      Past Medical History:   Diagnosis Date    Anal fistula     History of COVID-19 2021    History of syphilis     HIV disease (CMS-HCC)      Social History    Housing - in house in [place].    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    Social History     Socioeconomic History    Marital status: Single   Tobacco Use    Smoking status: Former     Current packs/day: 0.00     Types: Cigarettes     Quit date: 2020     Years since quitting: 5.0    Smokeless tobacco: Never   Vaping Use    Vaping status: Every Day    Substances: Nicotine   Substance and Sexual Activity    Alcohol use: Yes     Alcohol/week: 8.0 standard drinks of alcohol     Types: 8 Shots of liquor per week     Comment: weekends    Drug use: Yes     Frequency: 7.0 times per week     Types: Marijuana     Comment: smokes weed daily    Sexual activity: Yes     Partners: Male  Birth control/protection: Condom     Social Drivers of Health     Physical Activity: Sufficiently Active (03/12/2019)    Exercise Vital Sign     Days of Exercise per Week: 3 days     Minutes of Exercise per Session: 60 min   Stress: Stress Concern Present (03/12/2019)    [employer] of Occupational Health - Occupational Stress Questionnaire     Feeling of Stress : Rather much   Social Connections: Unknown (03/12/2019)    Social Connection and Isolation Panel [NHANES]     Frequency of Communication with Friends and Family: More than three times a week     Frequency of Social Gatherings with Friends and Family: More than three times a week     Attends Religious Services: Never     Database administrator or Organizations: No     Attends Banker Meetings: Never     Medications and Allergies  He has a current medication list which includes the following prescription(s): biktarvy and venlafaxine.    Allergies: Doxycycline    Family History  His family history includes Alzheimer's disease in his maternal grandfather and paternal grandfather; Crohn's disease in his father; Diabetes in his maternal grandmother; Heart failure in his maternal grandmother; Lymphoma in his paternal grandmother.     Objective      BP 104/73 (BP Site: L Arm, BP Position: Sitting, BP Cuff Size: Large)  - Pulse 75  - Temp 36.5 ??C (97.7 ??F) (Temporal)  - Ht 167.6 cm (5' 6)  - Wt 85.3 kg (188 lb)  - BMI 30.34 kg/m??      Const WDWN, NAD, non-toxic appearance ***   Eyes lids normal bilaterally, conjunctiva anicteric and noninjected OU ***  PERRL ***   ENMT normal appearance of external nose and ears, no nasal discharge ***  OP clear ***   Neck neck of normal appearance and trachea midline ***  no thyromegaly, nodules, or tenderness ***   Lymph no LAD in neck ***   CV RRR, no m/r/g, S1/S2 ***  no peripheral edema, WWP ***   Resp normal WOB ***  on RA, no breathlessness with speaking, no coughing, CTAB ***   GI normal inspection, NTND, NABS ***  no umbilical hernia on exam ***   GU deferred   MSK no clubbing or cyanosis of hands ***  no focal tenderness or abnormalities of joints of RUE, LUE, RLE, or LLE ***   Skin no rashes, lesions, or ulcers of visualized skin ***  no nodules or areas of induration of palpated skin ***   Neuro CNs II-XII grossly intact ***  sensation to light touch grossly intact throughout ***   Psych appropriate affect ***  oriented to person, place, time ***

## 2023-12-05 LAB — SYPHILIS SCREEN: SYPHILIS RPR SCREEN: NONREACTIVE

## 2023-12-05 LAB — HIV RNA, QUANTITATIVE, PCR: HIV RNA QNT RSLT: NOT DETECTED

## 2023-12-05 NOTE — Unmapped (Signed)
PROMIS Tablet Screening  Completed Date: 12/04/2023     SW reviewed self-administered screening.    Patient had a PHQ-9 score of 5  indicating Mild depression.   Pt denies SI.   Pt scored 11 on AUDIT/AUDIT-C indicating At-risk drinking   Pt  endorses Marijuana substance use in past 3 months.  Pt denies concerns for IPV.    Pt seen by provider for regular ID visit.  Pt was not seen in person by Social Work during this visit.     Blong Busk, LCSWA  [facility]

## 2023-12-05 NOTE — Unmapped (Signed)
Name: Kevin [name]  Date: 12/05/2023  Address: [address]  [place] [address]   [place] of Residence:  [facility]  Phone: [phone]     Started assessment with patient options: phone call    Is this the same address for mailing? Yes  If no, Mailing Address is:     What is your preferred method of contact? Phone Call    Is there anyone that you would want to add as your personal contact? No; if yes, please use SmartPhrase RWPersonalContactInfo to gather their contacts information.    N/A    Housing Status  Stable/Permanent; if so, what is their housing type: Renting and living - room, house, or apartment    Insurance  Private - Individual     Marital Status  Single    Tax Press photographer Status  Single    Employment Status  Employed Full Time    Income  Salary/Wages    If no or low income, how are you meeting your basic needs?  Not Applicable    List Tax Household Members including relationship to you:   N/A    Someone in my household receives: Not Applicable (for household members)  Specify who: N/A    Do you or anyone in your home have income adjustments? No    If yes, which adjustments do you have? N/A      Medication Access/Barriers: none    Do you have a current diagnosis for Hepatitis C?  Lab Results   Component Value Date    HEPCAB Reactive (A) 03/12/2019    HCVRNA Not Detected 03/27/2022       Federal Marketplace Eligibility Assessment  Patient has Marketplace coverage.     Patient given ACA education if they qualified based on answers to questions above.     MyChart  Do you have an active MyChart account? Yes     If MyChart is not set up, informed patient on how to set up MyChart N/A    Patient was informed of the following programs;   N/A    The following applications/handouts were given to patient:   N/A    The following forms were also started with the patient:   N/A    Medicaid:  N/A      Ryan White/HMAP application status: Incomplete; patient needs to send proof of income (4 paystubs)    Patient is applying for [employer] on Charges Only     Additional Comments: Sent email for proof of income.                Mickle Asper,  Benefits & Eligibility Coordinator  Time of Intervention: 7 minutes

## 2023-12-13 NOTE — Unmapped (Signed)
[facility] Refill Coordination Note    Specialty Medication(s) to be Shipped:   Infectious Disease: Biktarvy    Other medication(s) to be shipped:  venlafaxine     Kevin [name], DOB: Jan 31, 1991  Phone: [phone] (home)       All above HIPAA information was verified with patient's family member, father.     Was a Nurse, learning disability used for this call? No    Completed refill call assessment today to schedule patient's medication shipment from the [facility]  [phone]).  All relevant notes have been reviewed.     Specialty medication(s) and dose(s) confirmed: Regimen is correct and unchanged.   Changes to medications: Kevin [name] reports no changes at this time.  Changes to insurance: No  New side effects reported not previously addressed with a pharmacist or physician: None reported  Questions for the pharmacist: No    Confirmed patient received a Conservation officer, historic buildings and a Surveyor, mining with first shipment. The patient will receive a drug information handout for each medication shipped and additional FDA Medication Guides as required.       DISEASE/MEDICATION-SPECIFIC INFORMATION        N/A    SPECIALTY MEDICATION ADHERENCE     Medication Adherence    Patient reported X missed doses in the last month: 0  Specialty Medication: BIKTARVY 50-200-25 mg tablet (bictegrav-emtricit-tenofov ala)  Patient is on additional specialty medications: No  Patient is on more than two specialty medications: No  Any gaps in refill history greater than 2 weeks in the last 3 months: no  Demonstrates understanding of importance of adherence: yes  Informant: patient  Confirmed plan for next specialty medication refill: delivery by pharmacy  Refills needed for supportive medications: not needed          Refill Coordination    Has the Patients' Contact Information Changed: No  Is the Shipping Address Different: No         Were doses missed due to medication being on hold? No    BIKTARVY 50-200-25   mg: 5 days of medicine on hand       REFERRAL TO PHARMACIST     Referral to the pharmacist: Not needed      [facility]     Shipping address confirmed in Epic.       Delivery Scheduled: Yes, Expected medication delivery date: 12/19/23.     Medication will be delivered via Next Day Courier to the prescription address in Epic WAM.    [name]   [facility]  Specialty Technician

## 2023-12-18 DIAGNOSIS — Z6830 Body mass index (BMI) 30.0-30.9, adult: Principal | ICD-10-CM

## 2024-01-09 NOTE — Unmapped (Signed)
 [facility] Clinical Assessment & Refill Coordination Note    Kevin [name], DOB: 08-16-1991  Phone: [phone] (home)     All above HIPAA information was verified with patient's family member, father.     Was a Nurse, learning disability used for this call? No    Specialty Medication(s):   Infectious Disease: Biktarvy     Current Outpatient Medications   Medication Sig Dispense Refill    bictegrav-emtricit-tenofov ala (BIKTARVY) 50-200-25 mg tablet Take 1 tablet by mouth daily. 30 tablet 5    venlafaxine (EFFEXOR) 75 MG tablet Take 1 tablet (75 mg total) by mouth daily. 30 tablet 5    WEGOVY 0.25 MG/0.5 ML SUBCUTANEOUS PEN INJECTOR Inject 0.25 mg under the skin every seven (7) days. 2 mL 0     No current facility-administered medications for this visit.        Changes to medications: Alistair reports no changes at this time.    Medication list has been reviewed and updated in Epic: Yes    Allergies   Allergen Reactions    Doxycycline Other (See Comments)     GI upset       Changes to allergies: No    Allergies have been reviewed and updated in Epic: Yes    SPECIALTY MEDICATION ADHERENCE     Biktarvy 50-200-25 mg: 6 days of medicine on hand       Medication Adherence    Patient reported X missed doses in the last month: 0  Specialty Medication: Biktarvy 50-200-25mg  QD  Patient is on additional specialty medications: No  Informant: father          Specialty medication(s) dose(s) confirmed: Regimen is correct and unchanged.     Are there any concerns with adherence? No    Adherence counseling provided? Not needed    CLINICAL MANAGEMENT AND INTERVENTION      Clinical Benefit Assessment:    Do you feel the medicine is effective or helping your condition? Yes    Clinical Benefit counseling provided? Labs from 11/2023 show evidence of clinical benefit    Adverse Effects Assessment:    Are you experiencing any side effects? No    Are you experiencing difficulty administering your medicine? No    Quality of Life Assessment:    Quality of Life    Rheumatology  Oncology  Dermatology  Cystic Fibrosis          How many days over the past month did your HIV  keep you from your normal activities? For example, brushing your teeth or getting up in the morning. Patient declined to answer    Have you discussed this with your provider? Not needed    Acute Infection Status:    Acute infections noted within Epic:  No active infections  Patient reported infection: None    Therapy Appropriateness:    Is therapy appropriate based on current medication list, adverse reactions, adherence, clinical benefit and progress toward achieving therapeutic goals? Yes, therapy is appropriate and should be continued       HIV ASSOCIATED LABS:     Lab Results   Component Value Date/Time    HIVRS Not Detected 12/04/2023 05:03 PM    HIVRS Not Detected 04/30/2023 04:53 PM    HIVRS Not Detected 10/23/2022 04:19 PM    HIVCP <20 (H) 03/27/2022 04:25 PM    HIVCP 86 (H) 12/23/2019 02:11 PM    HIVCP 34,583 (H) 03/12/2019 04:46 PM    ACD4 840 04/30/2023  04:53 PM    ACD4 684 03/27/2022 04:25 PM    ACD4 360 (L) 05/04/2021 10:54 AM       DISEASE/MEDICATION-SPECIFIC INFORMATION      N/A    HIV: Not Applicable    PATIENT SPECIFIC NEEDS     Does the patient have any physical, cognitive, or cultural barriers? No    Is the patient high risk? No    Did the patient require a clinical intervention? No    Does the patient require physician intervention or other additional services (i.e., nutrition, smoking cessation, social work)? No    Does the patient have an additional or emergency contact listed in their chart? Yes    SOCIAL DETERMINANTS OF HEALTH     At the [facility], we have learned that life circumstances - like trouble affording food, housing, utilities, or transportation can affect the health of many of our patients.   That is why we wanted to ask: are you currently experiencing any life circumstances that are negatively impacting your health and/or quality of life? Patient declined to answer    Social Drivers of Health     Food Insecurity: Not on file   Internet Connectivity: Not on file   Housing/Utilities: Not on file   Tobacco Use: Medium Risk (04/30/2023)    Patient History     Smoking Tobacco Use: Former     Smokeless Tobacco Use: Never     Passive Exposure: Not on file   Transportation Needs: Not on file   Alcohol Use: Not on file   Interpersonal Safety: Not on file   Physical Activity: Sufficiently Active (03/12/2019)    Exercise Vital Sign     Days of Exercise per Week: 3 days     Minutes of Exercise per Session: 60 min   Intimate Partner Violence: Not At Risk (03/12/2019)    Humiliation, Afraid, Rape, and Kick questionnaire     Fear of Current or Ex-Partner: No     Emotionally Abused: No     Physically Abused: No     Sexually Abused: No   Stress: Stress Concern Present (03/12/2019)    [employer] of Occupational Health - Occupational Stress Questionnaire     Feeling of Stress : Rather much   Substance Use: Not on file (09/26/2023)   Social Connections: Unknown (03/12/2019)    Social Connection and Isolation Panel [NHANES]     Frequency of Communication with Friends and Family: More than three times a week     Frequency of Social Gatherings with Friends and Family: More than three times a week     Attends Religious Services: Never     Database administrator or Organizations: No     Attends Banker Meetings: Never     Marital Status: Not on file   Financial Resource Strain: Not on file   Depression: Not at risk (09/29/2022)    Received from [employer]    PHQ-2     Patient Health Questionnaire-2 Score: 0   Health Literacy: Not on file       Would you be willing to receive help with any of the needs that you have identified today? Not applicable       SHIPPING     Specialty Medication(s) to be Shipped:   Infectious Disease: Biktarvy    Other medication(s) to be shipped:  venlafaxine     Changes to insurance: No    Delivery Scheduled: Yes, Expected medication delivery  date: 01/14/24.     Medication will be delivered via Same Day Courier to the confirmed prescription address in [facility].    The patient will receive a drug information handout for each medication shipped and additional FDA Medication Guides as required.  Verified that patient has previously received a Conservation officer, historic buildings and a Surveyor, mining.    The patient or caregiver noted above participated in the development of this care plan and knows that they can request review of or adjustments to the care plan at any time.      All of the patient's questions and concerns have been addressed.    Darryl Nestle, PharmD   [facility] Specialty Pharmacist

## 2024-01-14 MED FILL — BIKTARVY 50 MG-200 MG-25 MG TABLET: ORAL | 30 days supply | Qty: 30 | Fill #1

## 2024-01-14 MED FILL — VENLAFAXINE 75 MG TABLET: ORAL | 30 days supply | Qty: 30 | Fill #1

## 2024-01-15 ENCOUNTER — Ambulatory Visit: Admit: 2024-01-15 | Payer: BLUE CROSS/BLUE SHIELD | Attending: Registered" | Primary: Registered"

## 2024-01-21 ENCOUNTER — Ambulatory Visit
Admit: 2024-01-21 | Discharge: 2024-01-22 | Payer: BLUE CROSS/BLUE SHIELD | Attending: Registered" | Primary: Registered"

## 2024-01-21 NOTE — Unmapped (Signed)
 [facility]    Adult Medical Nutrition Therapy - Initial Assessment/Care Plan      Date: 01/21/2024    Patient Name: Kevin [name]    Date of Birth / Age / Birth Gender: 1991/10/22 / 33 y.o. / Male    Referring MD or Clinic: Wynona Neat,*    Interpreter: No    Reason for Referral: General, healthful diet order yes: weight management and weight reduction    Nutritional Concerns/Comments: 33 y.o. male reports to initial nutrition visit with a BMI of 30.34 kg/m2 and  has a past medical history of Anal fistula, History of COVID-19 (2021), History of syphilis, and HIV disease (CMS-HCC). seeking nutritional counseling to mitigate elevated weight.     Pt has noticed a steady weight gain of >20 lbs in the past couple years and is interested in learning more about healthier choices to help with weight loss.  Pt notes excessive sugar-sweetened beverage intake (which includes ETOH use) and episodes of night-time binge eating triggered by daily cannabis use. Pt very motivated to make dietary changes at today's visit.    It seems that this weight gain is multifactorial:  -undesirable food choices  -binge eating episodes secondary to cannabis use  -excessive calorie intake  -irregular meal schedule  -sedentary lifestyle    HYPERLIPIDEMIA: Near optimal. Associated with undesirable food choices. RD educated pt on a heart-healthy diet.    Component  Ref Range & Units (hover) 12/04/23 1703 03/12/19 1646     Triglycerides 260 High  100 R    Cholesterol 209 High  169 R    HDL 33 Low  26 Low  R    LDL Calculated 124 High  123 High  R, CM     DIET: Pt typically skips breakfast and will have a sweetened coffee or sweet tea. First meal will be [employer]a and he will have a fried chicken sandwich with french fries and a lemonade. For dinner, pt will have 5 chicken wings and macaroni and cheese. PM snacks include: chips, cheese balls, cinnamon rolls, etc. Beverages include: lemonade, sweet tea, ETOH, juice.    PHYSICAL ACTIVITY:  Pt did not report physical activity during today's session.    FOOD INSECURITY:  Pt screens negative. Pt does not receive SNAP benefits nor accesses a food pantry.    Anthropometric Data:    Height: 5'6  Wt: 188 lbs  BMI: 30.34 kg/m2    Weight History:  Wt Readings from Last 6 Encounters:   12/04/23 85.3 kg (188 lb)   04/30/23 83 kg (183 lb)   11/27/22 77.8 kg (171 lb 8.3 oz)   11/07/22 81.6 kg (179 lb 14.3 oz)   10/23/22 81.6 kg (180 lb)   10/01/22 81.6 kg (180 lb)       Usual body weight: 150-160 lbs    Ideal Body Weight:  142 lbs  Goal weight: 160 lbs      Physical Findings Data:     BP Today: N/A (telehealth visit)  BP History:  BP Readings from Last 6 Encounters:   12/04/23 104/73   04/30/23 121/78   11/27/22 124/89   11/07/22 125/75   10/24/22 109/76   10/23/22 117/82       Biochemical Data:  A1C:    Hemoglobin A1C   Date Value Ref Range Status   12/04/2023 5.0 4.8 - 5.6 % Final     Estimated Blood Glucose:   Lab Results   Component Value Date  Estimated Average Glucose 97 12/04/2023     Total Cholesterol:   Cholesterol   Date Value Ref Range Status   12/04/2023 209 (H) <=200 mg/dL Final     Trig:   Triglycerides   Date Value Ref Range Status   12/04/2023 260 (H) 0 - 150 mg/dL Final     HDL:   HDL   Date Value Ref Range Status   12/04/2023 33 (L) 40 - 60 mg/dL Final      LDL:   LDL Calculated   Date Value Ref Range Status   12/04/2023 124 (H) 40 - 99 mg/dL Final     Comment:     NHLBI Recommended Ranges, LDL Cholesterol, for Adults (20+yrs) (ATPIII), mg/dL  Optimal              <478  Near Optimal        100-129  Borderline High     130-159  High                160-189  Very High            >=190  NHLBI Recommended Ranges, LDL Cholesterol, for Children (2-19 yrs), mg/dL  Desirable            <295  Borderline High     110-129  High                 >=130         Current Medications, Herbs, Supplements:    Current Outpatient Medications:     bictegrav-emtricit-tenofov ala (BIKTARVY) 50-200-25 mg tablet, Take 1 tablet by mouth daily., Disp: 30 tablet, Rfl: 5    venlafaxine (EFFEXOR) 75 MG tablet, Take 1 tablet (75 mg total) by mouth daily., Disp: 30 tablet, Rfl: 5    WEGOVY 0.25 MG/0.5 ML SUBCUTANEOUS PEN INJECTOR, Inject 0.25 mg under the skin every seven (7) days., Disp: 2 mL, Rfl: 0      HIV:   Lab Results   Component Value Date    ACD4 840 04/30/2023    CD4 40 04/30/2023    HIVRS Not Detected 12/04/2023    HIVCP <20 (H) 03/27/2022        Ecosocial History:  Client is not a SNAP recipient  Client is not a food bank/pantry recipient  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 For each statement, please tell me whether the statement was often true, sometimes true, or never true for your household in the 12 months.     1. ???We worried whether our food would run out before we got money to buy more.???   Never True     2. ???The food that we bought just didn't last, and we didn't have money to get more.  Never True       Usual Daily Food Choices:      24-Hour Recall/Usual Intake:  Time Intake   Breakfast Iced coffee or sweet tea   Snack (AM)    Lunch Chick[employer]fil[employer]a meal: plain fried chicken sandwich, fries, lemonade, pickles   Snack (PM)    Dinner Restaurant/fast-food: 5 piece chicken wings, mac and cheese   Snack (HS) Chips, cheese balls, candy or cinnamon rolls     Food and Nutrient Intake:  Snacks:  chips, cheese balls, candy or cinnamon rolls  Beverages:  lemonade, sweet tea, iced coffee  Dining Out:  2x per day.  Cooking Methods: n/a  Usual Food Choices: fried chicken, bread, french fries, macaroni and cheese, pickles   Meal Schedule:  skips breakfast, eats lunch and dinner    Behavioral Factors:  Overeating: Patient feels that they lose control over how much they eat.  Patient described multiple binge eating habits including excessive overeating  not being able to stop eating embarrassed by amount of food consumed.  Emotional Eating: No issues noted.  Grazing: No issues noted.  Fast Eating: No issues noted.  Nighttime Eating: Nighttime overeating.    Factors Affecting Food Intake:  Knowledge and Beliefs: He presents with food and nutrition-related knowledge deficit. He has not previously met with dietitian or participated in nutrition program.   Stress/Anxiety: Did not report issues with stress/anxiety.   Sleep Patterns: Patient noted sleep disturbance.   Food Safety and Access: No to little issues noted.  He did not report issues.   Other: n/a      Food Intolerances/Dietary Restrictions:  No known food allergies or food intolerances.     Other GI Issues:  Heartburn Increased gas and bloating    Hunger and Satiety:  Denied issues.       Allergies:  is allergic to doxycycline.      Usual Daily Physical Activity Factors: >/=1.0 to <1.4 (Sedentary)  Method for estimating physical activity factor: 2020-2025 DRI Guidelines    Estimated Energy Needs:    2200 kcal Resting Metabolic Rate by Mifflin-St Jeor equation  -500 kcal Calorie difference for weight goal  1700 kcal Total energy estimated need for weight goal    Protein: 85-103 g/kg [1.0-1.2 g/kg]  Carbohydrate: 40-65%  Fat: total fat: 20-35% of kcal, saturated fat: < 7% of kcal , trans fat: as low as possible  Fiber: 30-35g   Sodium:  <2400 mg   Cholesterol: <200 mg  Added Sugars: < 35 g (AHA recommendations)   Fluid: 1700-2200 ml/kcal {41ml/kcal]    Estimated Adherence:  Client self-reported adherence score 10  Method for estimating adherence: Adult Meducation Readiness Ruler      NUTRITION DIAGNOSIS:   Excessive  energy intake   Inadequate  intake of grains  Inadequate  intake of fruits  Inadequate  intake of vegetables  Inadequate  intake of milk/milk products  Inadequate  intake of meat, poultry, fish, eggs, beans, nut products    Method for estimating nutritional adequacy: 2020-2025 DRI Guidelines     Clinical Diagnosis:   Overweight/obesity related to excessive calorie intake and undesirable food choices as evidenced by most recent BMI of 30.34 kg/m2, self-reported weight gain of 22 lbs and self-reported high intake of sugar-sweetened beverages, processed food intake.            NUTRITION INTERVENTIONS/PLAN OF CARE    Nutrition Counseling     Nutrition Education     Realistic weight goal setting: 0.5 to up to 2 lbs per week.                                                   Comprehensive weight management program includes a reduced calorie diet, increased fruit/vegetable/whole grain consumption and increased physical activity by 30 minutes, 5x per week.    Nutrient adequacy during weight  loss provided for in individualized meal plan that includes client???s preferences: energy  deficit of 250-500 kcal for a total caloric consumption of 1700 kcal/day.    Behavior therapy strategies of self-monitoring food intake, physical activity and goal setting.    Coordination of care via Fax, Mail, and Medical Record    Client agreed to follow interventions:  Yes       Nutrition-Related Medication Management: Nutrition-related complimentary/alternative medicine      Nutrition Goals:  Reduce consumption of sugar-sweetened/caloric beverages from sweet teas, soda, juice, alcoholic beverages and replace with water, zero-sugar beverages.  Healthy dining out options:  Choose whole-grain/whole-wheat/baked sweet potato or baked potato  Lean protein: chicken, [place], fish, shrimp, beans, peanut butter, low-fat cheese/dairy products  Healthier sides: side salad, fruit cup, baked potato, coleslaw, steamed vegetables  Condiments: always put on the side, choose oil-vinegar dressings over cream-based  Healthier snack options:  Fresh fruit  Low-fat, low-sugar greek yogurts (Chobani and Oikos)  Nuts such as cashews  Low-fat string cheeses  Whole-grain bread with peanut butter and banana slices  Rice cakes with peanut butter with raspberries  4. When making sweet tea at home, substitute 1/2 of sugar amount with monkfruit or sugar substitute.   5. My plate guidelines:  1/2 plate fruit or vegetable  1/4 plate lean protein  1/4 plate whole-grain    Patient Education:  Individualized nutrition counseling and education provided on:  My Plate Guidelines  Added Sugars  Stable Meal Schedule   cholesterol nutrition therapy      Materials Provided:  Patient instructions: Prominent nutrition related recommendations.   Handouts supporting dietary recommendations: n/a  Additional Materials: Written nutrition tips , Online resources       NUTRITION MONITORING AND EVALUATION:  Follow-up appointment: telephone/video March 24th at 830am      Goals for next appointment:   1.Reduce consumption of sugar-sweetened/caloric beverages from sweet teas, soda, juice, alcoholic beverages and replace with water, zero-sugar beverages.  2.Healthy dining out options:  Choose whole-grain/whole-wheat/baked sweet potato or baked potato  Lean protein: chicken, [place], fish, shrimp, beans, peanut butter, low-fat cheese/dairy products  Healthier sides: side salad, fruit cup, baked potato, coleslaw, steamed vegetables  Condiments: always put on the side, choose oil-vinegar dressings over cream-based  3.Healthier snack options:  Fresh fruit  Low-fat, low-sugar greek yogurts (Chobani and Oikos)  Nuts such as cashews  Low-fat string cheeses  Whole-grain bread with peanut butter and banana slices  Rice cakes with peanut butter with raspberries  4. When making sweet tea at home, substitute 1/2 of sugar amount with monkfruit or sugar substitute.   5. My plate guidelines:  1/2 plate fruit or vegetable  1/4 plate lean protein  1/4 plate whole-grain    Number of preferred RD visits per dx: 12-14 visits    Plan of care time frame: 6-12 months    Length of visit was:  51.51  minutes      The patient reports they are physically located in [place] and is currently: at home. I conducted a phone visit.  I spent 51.51 minutes on the phone call with the patient on the date of service .        Signed:  Unk Pinto  01/21/2024 8:31 AM

## 2024-01-21 NOTE — Unmapped (Signed)
 Goals for next appointment:   1.Reduce consumption of sugar-sweetened/caloric beverages from sweet teas, soda, juice, alcoholic beverages and replace with water, zero-sugar beverages.  2.Healthy dining out options:  Choose whole-grain/whole-wheat/baked sweet potato or baked potato  Lean protein: chicken, [place], fish, shrimp, beans, peanut butter, low-fat cheese/dairy products  Healthier sides: side salad, fruit cup, baked potato, coleslaw, steamed vegetables  Condiments: always put on the side, choose oil-vinegar dressings over cream-based  3.Healthier snack options:  Fresh fruit  Low-fat, low-sugar greek yogurts (Chobani and Oikos)  Nuts such as cashews  Low-fat string cheeses  Whole-grain bread with peanut butter and banana slices  Rice cakes with peanut butter with raspberries  4. When making sweet tea at home, substitute 1/2 of sugar amount with monkfruit or sugar substitute.   5. My plate guidelines:  1/2 plate fruit or vegetable  1/4 plate lean protein  1/4 plate whole-grain

## 2024-01-30 NOTE — Unmapped (Signed)
 [facility] Refill Coordination Note    Specialty Medication(s) to be Shipped:   Infectious Disease: Biktarvy    Other medication(s) to be shipped:  venlafaxine     Kevin [name], DOB: 13-Mar-1991  Phone: There are no phone numbers on file.      All above HIPAA information was verified with patient's family member, father.     Was a Nurse, learning disability used for this call? No    Completed refill call assessment today to schedule patient's medication shipment from the [facility]  [phone]).  All relevant notes have been reviewed.     Specialty medication(s) and dose(s) confirmed: Regimen is correct and unchanged.   Changes to medications: [name] reports no changes at this time.  Changes to insurance: No  New side effects reported not previously addressed with a pharmacist or physician: None reported  Questions for the pharmacist: No    Confirmed patient received a Conservation officer, historic buildings and a Surveyor, mining with first shipment. The patient will receive a drug information handout for each medication shipped and additional FDA Medication Guides as required.       DISEASE/MEDICATION-SPECIFIC INFORMATION        N/A    SPECIALTY MEDICATION ADHERENCE     Medication Adherence    Patient reported X missed doses in the last month: 0  Specialty Medication: BIKTARVY 50-200-25 mg tablet (bictegrav-emtricit-tenofov ala)  Patient is on additional specialty medications: No  Patient is on more than two specialty medications: No  Any gaps in refill history greater than 2 weeks in the last 3 months: no  Demonstrates understanding of importance of adherence: yes  Informant: patient  Confirmed plan for next specialty medication refill: delivery by pharmacy  Refills needed for supportive medications: not needed          Refill Coordination    Has the Patients' Contact Information Changed: No  Is the Shipping Address Different: No         Were doses missed due to medication being on hold? No    BIKTARVY 50-200-25   mg: 10 days of medicine on hand     REFERRAL TO PHARMACIST     Referral to the pharmacist: Not needed      [facility]     Shipping address confirmed in Epic.     Cost and Payment: Patient has a $0 copay, payment information is not required.    Delivery Scheduled: Yes, Expected medication delivery date: 02/11/24.     Medication will be delivered via Same Day Courier to the prescription address in Epic WAM.    [name]   [facility]  Specialty Technician

## 2024-02-11 ENCOUNTER — Ambulatory Visit: Admit: 2024-02-11 | Payer: BLUE CROSS/BLUE SHIELD | Attending: Registered" | Primary: Registered"

## 2024-02-11 MED FILL — BIKTARVY 50 MG-200 MG-25 MG TABLET: ORAL | 30 days supply | Qty: 30 | Fill #2

## 2024-02-11 MED FILL — VENLAFAXINE 75 MG TABLET: ORAL | 30 days supply | Qty: 30 | Fill #2

## 2024-02-18 NOTE — Unmapped (Signed)
 Patient needs to reschedule missed telephone appointment with Donnald Garre

## 2024-02-27 ENCOUNTER — Emergency Department
Admit: 2024-02-27 | Discharge: 2024-02-27 | Disposition: A | Discharge status: Home / Self Care | Attending: Emergency Medicine

## 2024-02-27 DIAGNOSIS — L0291 Cutaneous abscess, unspecified: Principal | ICD-10-CM

## 2024-02-27 MED ADMIN — lidocaine-EPINEPHrine (XYLOCAINE W/EPI) 1 %-1:100,000 injection 1 mL: 1 mL | @ 20:00:00 | Stop: 2024-02-27

## 2024-02-27 NOTE — Unmapped (Addendum)
 Patient presents for abscess under chin, hx of same with needing I&D, Started sulfa on Sunday and feels it's getting worse. Denies PMH.

## 2024-02-27 NOTE — Unmapped (Addendum)
 [facility]  Emergency Department Provider Note     HPI     Kevin [name] is a very pleasant 33 y.o. male with a PMH of HIV, syphilis presenting to the ED today for evaluation of submandibular abscess.  Patient last had an abscess in this location in 2023 that required incision and drainage.  He states that the abscess has been slowly swelling over the course of 1 week.  He was seen previously at the beginning of the swelling and prescribed Bactrim however despite taking Bactrim daily over the last 4 days, his swelling and pain has increased.  He denies any fevers, chills, nausea, vomiting, difficulty swallowing or shortness of breath.  No dental pain.  Patient also notes that his HIV is undetectable, consistently taken Biktarvy and has routine testing to ensure his HIV quant remains undetectable.      MDM     Final diagnoses:   Abscess (Primary)       Vitals: BP 143/99  - Pulse 107  - Temp 36.3 ??C (97.3 ??F) (Oral)  - Resp 18  - Ht 170.2 cm (5' 7)  - Wt 80.7 kg (178 lb)  - SpO2 99%  - BMI 27.88 kg/m??      MDM: Kevin [name] is a very pleasant 33 y.o. male with a PMH of HIV, syphilis presenting to the ED today for evaluation of submandibular abscess.       Afebrile, hemodynamically stable. On exam, patient is overall well-appearing in no acute distress.  2.5 x 2.5 cm fluctuant abscess located in the patient's anterior submandibular region with overlying erythema, no induration.  Ultrasound used at bedside consistent with abscess with superficial anechoic fluid.  Bedside incision and drainage performed without complication.  Copious serosanguineous fluid was expressed from the abscess.  Culture was sent to the lab for sensitivity testing.  Patient will continue his prescription for Bactrim to completion.  Patient has follow-up tomorrow with his primary doctor.  Wound instructions, return precautions and discharge plan with follow-up as discussed in detail, patient understands and agrees with this plan.    Diagnostics:  Orders Placed This Encounter   Procedures    INCISION AND DRAINAGE    Anaerobic/ Aerobic Culture    Ambulatory referral to ENT    Wound dressing Apply; Dry gauze            The case was discussed with the attending physician who is in agreement with the above assessment and plan.    Additional MDM Elements         Discussion with other professionals: See MDM above  Independent interpretation: See MDM above                  Physical Exam     Vitals:    02/27/24 1407   BP: 143/99   Pulse: 107   Resp: 18   Temp: 36.3 ??C (97.3 ??F)   TempSrc: Oral   SpO2: 99%   Weight: 80.7 kg (178 lb)   Height: 170.2 cm (5' 7)        Constitutional: Well-appearing, NAD.   HEENT: Normocephalic and atraumatic. Conjunctivae are normal. EOMI.  Mucous membranes are moist. 2.5 x 2.5 cm fluctuant abscess located in the patient's anterior submandibular region with overlying erythema, no induration.   Neck: Full active ROM  Cardiovascular: See heart rate listed above.  Extremities are warm and well-perfused.  Respiratory: See respiratory rate listed above.  Speaking easily in full sentences without audible  stridor or wheezing.  Equal chest rise without increased work of breathing.  Musculoskeletal: No long bone deformities.  No lower extremity edema.   Neurologic: No facial asymmetry.  Normal speech and language.  Moving all extremities symmetrically, spontaneously  Skin: Skin is warm, dry.  Psychiatric: Mood and affect are normal.     Past History     PAST MEDICAL HISTORY/PAST SURGICAL HISTORY:   Past Medical History:   Diagnosis Date    Anal fistula     History of COVID-19 2021    History of syphilis     HIV disease        Past Surgical History:   Procedure Laterality Date    DENTAL SURGERY      PR EXC TUMOR SOFT TISSUE NECK/ANT THORAX SUBFASCIAL <5CM Left 04/06/2020    Procedure: EXCISION TUMOR, SOFT TISSUE OF NECK OR THORAX; DEEP, SUBFASCIAL (EG, INTRAMUSCULAR); LESS THAN 5 CM;  Surgeon: Rufino Coulter, MD;  Location: MAIN OR [facility];  Service: ENT    PR REMOVAL ANAL FISTULA,SUBCUTANEOUS N/A 11/27/2022    Procedure: RECTAL EXAM UNDER ANESTHESIA WITH TREATMENT OF ANAL FISTULA AND REMOVAL OF SETON;  Surgeon: Addie Addison, MD;  Location: OR [place];  Service: General Surgery    PR REMOVAL ANAL FISTULA,TRANS/SUPRA/EXTRAPHINCT, +/-SETON N/A 09/11/2022    Procedure: SURGICAL TX ANAL FISTULA (FISTULECT/FISTULOT); TRANS/SUPRA/EXTRASPHINCTERIC, W/PLACEMENT SETON IF PERFORMED;  Surgeon: Leonarda Rakers, MD;  Location: [facility] OR [facility];  Service: General Surgery    PR SURG DIAGNOSTIC EXAM, ANORECTAL N/A 09/11/2022    Procedure: ANORECTAL EXAM, SURGICAL, REQUIRING ANESTHESIA (GENERAL, SPINAL, OR EPIDURAL), DIAGNOSTIC;  Surgeon: Leonarda Rakers, MD;  Location: [facility] OR [facility];  Service: General Surgery    PR SURG DIAGNOSTIC EXAM, ANORECTAL N/A 09/26/2022    Procedure: RECTAL EXAM WITH TREATMENT OF ANAL FISTULA, SETON PLACEMENT;  Surgeon: Addie Addison, MD;  Location: OR [place];  Service: General Surgery       MEDICATIONS:     Current Facility-Administered Medications:     lidocaine-EPINEPHrine (XYLOCAINE W/EPI) 1 %-1:100,000 injection 1 mL, 1 mL, Infiltration, Once, Alfredo Ano, DO    Current Outpatient Medications:     bictegrav-emtricit-tenofov ala (BIKTARVY) 50-200-25 mg tablet, Take 1 tablet by mouth daily., Disp: 30 tablet, Rfl: 5    venlafaxine (EFFEXOR) 75 MG tablet, Take 1 tablet (75 mg total) by mouth daily., Disp: 30 tablet, Rfl: 5    WEGOVY 0.25 MG/0.5 ML SUBCUTANEOUS PEN INJECTOR, Inject 0.25 mg under the skin every seven (7) days., Disp: 2 mL, Rfl: 0    ALLERGIES:   Doxycycline    SOCIAL HISTORY:   Social History     Tobacco Use    Smoking status: Former     Current packs/day: 0.00     Types: Cigarettes     Quit date: 2020     Years since quitting: 5.2    Smokeless tobacco: Never   Substance Use Topics    Alcohol use: Yes     Alcohol/week: 8.0 standard drinks of alcohol     Types: 8 Shots of liquor per week     Comment: weekends       FAMILY HISTORY:  Family History   Problem Relation Age of Onset    Crohn's disease Father     Diabetes Maternal Grandmother     Heart failure Maternal Grandmother     Alzheimer's disease Maternal Grandfather     Lymphoma Paternal Grandmother     Alzheimer's disease Paternal Grandfather  Radiology     No orders to display        Laboratory Data     Lab Results   Component Value Date    WBC 9.2 12/04/2023    HGB 16.2 12/04/2023    HCT 46.2 12/04/2023    PLT 240 12/04/2023       Lab Results   Component Value Date    NA 139 12/04/2023    K 3.8 12/04/2023    CL 105 12/04/2023    CO2 21.9 12/04/2023    BUN 8 (L) 12/04/2023    CREATININE 0.97 12/04/2023    GLU 80 12/04/2023    CALCIUM 9.6 12/04/2023       Lab Results   Component Value Date    BILITOT 0.6 12/04/2023    PROT 8.3 03/12/2019    ALBUMIN 4.3 03/12/2019    ALT 44 12/04/2023    AST 24 12/04/2023    ALKPHOS 109 03/12/2019       No results found for: LABPROT, INR, APTT    Emmy Harper, DO  [employer]    Emergency Medicine, PGY-2    Portions of this record have been created using Scientist, clinical (histocompatibility and immunogenetics). Dictation errors have been sought, but may not have been identified and corrected.         Alfredo Ano, DO  Resident  02/27/24 1549       Alfredo Ano, DO  Resident  02/27/24 [phone]

## 2024-02-27 NOTE — Unmapped (Signed)
 ED Procedure Note    Incision/Drainage    Date/Time: 02/27/2024 3:46 PM    Performed by: Alfredo Ano, DO  Authorized by: French Jester, MD    Consent:     Consent obtained:  Verbal    Consent given by:  Patient    Risks discussed:  Bleeding, incomplete drainage, pain and infection  Universal protocol:     Patient identity confirmed:  Verbally with patient  Location:     Type:  Abscess    Size:  2.5 x 2.5 cm    Location:  Neck    Neck location:  L anterior  Pre-procedure details:     Skin preparation:  Chlorhexidine  Anesthesia:     Anesthesia method:  Local infiltration    Local anesthetic:  Lidocaine 1% WITH epi  Procedure type:     Complexity:  Simple  Procedure details:     Incision types:  Stab incision    Incision depth:  Subcutaneous    Wound management:  Probed and deloculated and irrigated with saline    Drainage:  Bloody and purulent    Drainage amount:  Copious    Wound treatment:  Wound left open    Packing materials:  None  Post-procedure details:     Procedure completion:  Tolerated well, no immediate complications

## 2024-03-05 NOTE — Unmapped (Signed)
 Anaerobic/ Aerobic Culture  Order: [id number]   Status: Final result      Neck; Swab, Intraoperative Collection  Aerobic/Anaerobic Culture   <1+ Skin Flora Isolated  3+ Mixed Anaerobes Isolated    Final results reviewed. Aerobic/Anaerobic culture is negative. No further action needed.

## 2024-03-07 NOTE — Unmapped (Signed)
 Otolaryngology-Head and Neck Surgery Clinic Note    Date of Service:  03/10/2024    Subjective:   Chief Complaint: Left neck mass    Kevin [name] is a 33 y.o. male with a submandibular abscess. He previously had a left neck epidermal inclusion cyst excised by Dr. Tally Faes. She was referred from Dr. Macdonald Savoy.     Reports he first noticed a subcentimeter mass left of midline in the superior neck about 6 or 7 years ago.  It remained stable in size until about 5 years ago when it acutely enlarged to the size of a golf ball, became tender, and became erythematous.  This completely resolved with outpatient antibiotic therapy.  The mass was then again stable for few years and infection recurred in 2023.  That bout was treated both with outpatient antibiotics and I&D in the ED.  Just 2 weeks ago he had his third recurrence that was treated with both Bactrim  and I&D.  Cultures from his first I&D grew Staph aureus.    Besides bilateral axillary lymphadenopathy during the bouts of infection, he denies any other masses or lesions elsewhere on his body.  Denies pus or foul taste in the oral cavity.  Reports that his whole neck feels swollen during the bouts of infection.  Denies intraoral component.  Denies difficulty breathing.  Reports that swallow becomes painful during the bouts of infection.  Reports that his beard hairs fall out during the bouts of infection.  Denies any changes to his facial function or symmetry.    Has never before had cross-sectional imaging of the head or neck.  Denies personal family history of head or neck cancer.  Reports he started smoking cigarettes at the age of about 19, and stopped at about 22 when he transition to vaping, which he currently does.  Also smokes marijuana.  Reports he drinks about 3 drinks a few nights per week.  Denies unintentional weight loss.    Has previously tried to quit smoking tobacco.  Reports his HIV is well-controlled.  He is a Interior and spatial designer.    A summary of recent studies includes:  No prior head or neck imaging.    Past history was reviewed, and summarized here:  PMH: HIV, syphilis   PSHx: left neck mass excision for epidermal inclusion cyst    All: doxycycline    Meds: Biktarvy , Wegovy   SocHx: Smoked cigarettes from age [age] after which he began vaping.  Currently continues to vape and smoke marijuana.  FmHx: None known    Past Medical History:   Diagnosis Date    Anal fistula     History of COVID-19 2021    History of syphilis     HIV disease       Past Surgical History:   Procedure Laterality Date    DENTAL SURGERY      PR EXC TUMOR SOFT TISSUE NECK/ANT THORAX SUBFASCIAL <5CM Left 04/06/2020    Procedure: EXCISION TUMOR, SOFT TISSUE OF NECK OR THORAX; DEEP, SUBFASCIAL (EG, INTRAMUSCULAR); LESS THAN 5 CM;  Surgeon: Rufino Coulter, MD;  Location: MAIN OR [facility];  Service: ENT    PR REMOVAL ANAL FISTULA,SUBCUTANEOUS N/A 11/27/2022    Procedure: RECTAL EXAM UNDER ANESTHESIA WITH TREATMENT OF ANAL FISTULA AND REMOVAL OF SETON;  Surgeon: Addie Addison, MD;  Location: OR [place];  Service: General Surgery    PR REMOVAL ANAL FISTULA,TRANS/SUPRA/EXTRAPHINCT, +/-SETON N/A 09/11/2022    Procedure: SURGICAL TX ANAL FISTULA (FISTULECT/FISTULOT); TRANS/SUPRA/EXTRASPHINCTERIC, W/PLACEMENT SETON IF PERFORMED;  Surgeon: Debroah Fanning  Forestine Igo, MD;  Location: [facility] OR [facility];  Service: General Surgery    PR SURG DIAGNOSTIC EXAM, ANORECTAL N/A 09/11/2022    Procedure: ANORECTAL EXAM, SURGICAL, REQUIRING ANESTHESIA (GENERAL, SPINAL, OR EPIDURAL), DIAGNOSTIC;  Surgeon: Leonarda Rakers, MD;  [facility]: [facility];  Service: General Surgery    PR SURG DIAGNOSTIC EXAM, ANORECTAL N/A 09/26/2022    Procedure: RECTAL EXAM WITH TREATMENT OF ANAL FISTULA, SETON PLACEMENT;  Surgeon: Addie Addison, MD;  Location: OR [place];  Service: General Surgery     Family History   Problem Relation Age of Onset    Crohn's disease Father     Diabetes Maternal Grandmother Heart failure Maternal Grandmother     Alzheimer's disease Maternal Grandfather     Lymphoma Paternal Grandmother     Alzheimer's disease Paternal Grandfather      Current Outpatient Medications   Medication Sig Dispense Refill    bictegrav-emtricit-tenofov ala (BIKTARVY ) 50-200-25 mg tablet Take 1 tablet by mouth daily. 30 tablet 5    venlafaxine  (EFFEXOR ) 75 MG tablet Take 1 tablet (75 mg total) by mouth daily. 30 tablet 5    WEGOVY  0.25 MG/0.5 ML SUBCUTANEOUS PEN INJECTOR Inject 0.25 mg under the skin every seven (7) days. 2 mL 0     No current facility-administered medications for this visit.     Allergies   Allergen Reactions    Doxycycline  Other (See Comments)     GI upset     Social History     Socioeconomic History    Marital status: Single   Tobacco Use    Smoking status: Former     Current packs/day: 0.00     Types: Cigarettes     Quit date: 2022     Years since quitting: 3.3    Smokeless tobacco: Never   Vaping Use    Vaping status: Every Day    Substances: Nicotine   Substance and Sexual Activity    Alcohol use: Yes     Alcohol/week: 16.0 standard drinks of alcohol     Types: 8 Glasses of wine, 8 Shots of liquor per week     Comment: weekends    Drug use: Yes     Frequency: 7.0 times per week     Types: Marijuana     Comment: smokes weed daily    Sexual activity: Yes     Partners: Male     Birth control/protection: Condom     Social Drivers of Health     Food Insecurity: No Food Insecurity (03/10/2024)    Hunger Vital Sign     Worried About Running Out of Food in the Last Year: Never true     Ran Out of Food in the Last Year: Never true   Transportation Needs: No Transportation Needs (03/10/2024)    PRAPARE - Therapist, art (Medical): No     Lack of Transportation (Non-Medical): No   Physical Activity: Sufficiently Active (03/12/2019)    Exercise Vital Sign     Days of Exercise per Week: 3 days     Minutes of Exercise per Session: 60 min   Stress: Stress Concern Present (03/12/2019)    [employer] of Occupational Health - Occupational Stress Questionnaire     Feeling of Stress : Rather much   Social Connections: Unknown (03/12/2019)    Social Connection and Isolation Panel [NHANES]     Frequency of Communication with Friends and Family: More than three times a week  Frequency of Social Gatherings with Friends and Family: More than three times a week     Attends Religious Services: Never     Database administrator or Organizations: No     Attends Banker Meetings: Never   Housing: Low Risk  (03/10/2024)    Housing     Within the past 12 months, have you ever stayed: outside, in a car, in a tent, in an overnight shelter, or temporarily in someone else's home (i.e. couch-surfing)?: No     Are you worried about losing your housing?: No       Review of Systems  A 10 system review of systems was negative except as noted in HPI and intake encounter form, which was reviewed and placed in the medical record.    Objective:     BP 131/78 (BP Site: L Arm, BP Position: Sitting)  - Pulse 95  - Temp 36.6 ??C (97.8 ??F) (Temporal)  - Resp 16  - Ht 167.6 cm (5' 6)  - Wt 81.2 kg (179 lb)  - SpO2 98%  - BMI 28.89 kg/m??     Head and Neck Exam:   Appearance:   NAD, age-appropriate appearance, well-developed, normal habitus   Communication:   communicates verbally, normal voice quality   Head/Face:   Inspection -  Normal appearance, approximately 2 cm vertical well-healed scar inferior posterior to the left ear.  Skin appears normal   Salivary glands -  Normal size, no tenderness, swelling, or palpable masses   Facial strength -  Normal and symmetric bilateral   Ears:  Inspection -  Normal appearance, no scars or visible lesions   Normal clinical speech reception threshold   Nose:  External Inspection -  Normal appearance, no scars or visible lesions   Internal Inspection -  Normal mucosa, septum, and turbinates   Oral Cavity:  Lips -  Normal mucosa, oral competence, and stoma size Age-appropriate dentition, healthy gingival mucosa   Hard palate, buccal, floor of mouth mucosa normal   Tongue - normal movement, no lesions, normal sensation   Oropharynx  Soft palate mucosa normal, no asymmetry, normal elevation   Tonsils intact and normal size, symmetric, no abnormal lesion   Pharynx mucosa normal, well-hydrated   Nasopharynx  Hypopharynx  Larynx  Mirror examination deferred due to gag   Neck: 2 cm, soft, nontender, mobile in left level 1B with out overlying erythema, with focal overlying alopecia.  Otherwise normal appearance, no visible mass or asymmetry   Normal palpation, no tenderness, no tracheal deviation   Thyroid -  Normal palpation, normal size, no tenderness  Normal range of motion   Lymphatic:  cervical lymph nodes normal to palpation   Cardiovascular:  warm, pink, well-perfused extremities without swelling, tenderness, or edema   Respiratory:  symmetric chest expansion with normal respiratory effort, no stridor   Eyes  ocular motility normal   Neuro/Psych.:  mood/affect -  normal  mental status -  AAOx3  cranial nerves -  II-XII intact      Assessment:     1. Smoking addiction    2. Abscess      33 year old male with a history of HIV (undetectable, most recent RNA 12/04/2023) and syphilis presenting with a persistent left level 1B mass complicated by recurrent infections requiring 2 prior I&D's.  Differential is broad.  Will start with cross-sectional imaging to determine next steps and surgical plan.    I spent 5 minutes reviewing the merits of tobacco  cessation, including be not limited to prevention of cancer recurrence, prevention of new primary aerodigestive tract cancers, improved cardiopulmonary health, and improved wound healing.  I encouraged the patient to continue attempts to quit.  I will contact our tobacco cessation support group so they may follow up with the patient.  He is amenable to this.    Plan:   CT neck with IV contrast.  Will follow-up with the results by phone.  Smoking cessation referral  Follow-up pending imaging results     Staff Physician Comments  I saw and evaluated the patient, participating in the key elements of the service. I discussed the findings, assessment and plan with the resident and agree with resident???s findings and plan as documented in the note. I was present for and involved with the entirety of all procedures perfomed.        Violeta Grey, MD  Assistant Professor  Otolaryngology - Head and Neck Surgery

## 2024-03-10 ENCOUNTER — Ambulatory Visit
Admit: 2024-03-10 | Discharge: 2024-03-11 | Payer: BLUE CROSS/BLUE SHIELD | Attending: Otolaryngology | Primary: Otolaryngology

## 2024-03-10 DIAGNOSIS — L0291 Cutaneous abscess, unspecified: Principal | ICD-10-CM

## 2024-03-10 DIAGNOSIS — F17200 Nicotine dependence, unspecified, uncomplicated: Principal | ICD-10-CM

## 2024-03-10 NOTE — Unmapped (Signed)
 Parke Simmers: [phone]    For urgent nursing questions please call the triage nurse line at [phone] during business hours 8am - 5pm.  After hours, please call [phone] and ask for the ENT physician on call.  If you are concerned, please do not hesitate to seek medical care at your local emergency department.    SCHEDULING  For surgery scheduling please call Marsela at [phone].  For scheduling clinic appointments please call [phone].  For scheduling speech therapy appointments please call [phone].    ABOUT YOUR CARE  For Oncology related questions, please call [phone].  Suzy Bouchard, Nurse Navigator for Drs. Milinda Antis, and Ariton.  Melvyn Novas, Nurse Navigator for Drs. Cecille Po, and Head and Neck Fellow    PAPERWORK/FORMS  Please include Name and Date of Birth on all faxes. FAX: [phone], Attn: Nurse Navigator.  Please allow 7-10 Business Days to complete any FMLA or Disability paperwork. Please include specific dates regarding time out of work.  Forms can not be returned by e-mail. Please include specific instructions regarding where completed forms should be returned.     IMAGING APPOINTMENTS   For appointments or questions about radiology appointments please call [phone];  Option 1 for CT, MRI, and ultrasounds.   Option 2 for PET scans.  Your ordering provider will contact you to discuss results.    MyChart Messages  For your safety and best care, please do not use MyChart messages for reporting symptoms or urgent messages.   Symptoms should be reported by calling the triage line above.   MyChart messages are checked only during weekday business hours.    Please use MyChart for non-urgent prescription refills, non-urgent scheduling issues, or non-urgent general questions/concerns.   Non-urgent concerns communicated via MyChart will be addressed within 24 business hours.   Please remember, clinic is only open from 8am-4:30pm and is closed over the weekend as well as major holidays.   MyChart messages will not be seen or answered during the times clinic is closed.  Note that MyChart messages may be routed a central pool. A team member of your provider will get back to you as quickly as possible.     Bethann Humble, RN

## 2024-03-11 NOTE — Unmapped (Signed)
 Left message asking patient to call back to schedule CT scan.    Call back left.

## 2024-03-12 NOTE — Unmapped (Signed)
 The [facility] has made a second and final attempt to reach this patient to refill the following medication:BIKTARVY  50-200-25 mg tablet (bictegrav-emtricit-tenofov ala).      We have left voicemails on the following phone numbers: [phone], have sent a MyChart message, and have sent a text message to the following phone numbers: [phone] .    Dates contacted: 03/03/2024 and 03/12/2024  Last scheduled delivery: 02/11/2024    The patient may be at risk of non-compliance with this medication. The patient should call the [facility] at [phone]  Option 4, then Option 4: Infectious Disease, Transplant to refill medication.    Lanny Plan   [facility] Delivery Oncologist

## 2024-03-12 NOTE — Unmapped (Signed)
 [facility] Other Call ONC Phone Room Smart Lists: General -     Hi,     Kevin [name] contacted the Communication Center regarding the following:    - Asked to be called back. Stated he received a missed call about a CT.    Please contact Kevin [name] at [phone].    Thanks in advance,    Noelle Batten  [facility] Cancer Communication Center   [phone]    [facility]

## 2024-03-14 NOTE — Unmapped (Signed)
 Made third and final attempt to counsel Kevin [name], 32 y.o. for treatment of tobacco use/dependence. Pt was referred by Almedia Arista, MD in the ENT Oncology clinic. Pt will be enrolled in automated phone program, which will provide opportunities for pt to connect with Tobacco Treatment Program in the future. In addition, pt may be re-referred for services at any time.    Tobacco Use Treatment  Program: [facility]  Type of Visit: Attempt  Tobacco Use Treatment Visit: Patient not available  Permission To Engage In Conversation Re: PHI w/ Visitors Present: n/a    Cancer Center Patients  Primary Service: ENT  Primary Cancer Type: Other (Left neck mass)    Tobacco Use During Past 30 Days  Type of Tobacco Products Used: E-cigarette/vape    TTS Information  Diagnosis: Tobacco use disorder, unspecified, uncomplicated (F17.200)   TTS Visit Length: Less than 3 minutes    Kevin [name], Kevin [name], Kevin [name]  Tobacco Treatment Counselor  [facility] Tobacco Treatment Program  [phone]

## 2024-03-26 ENCOUNTER — Ambulatory Visit: Admit: 2024-03-26 | Discharge: 2024-03-27 | Payer: BLUE CROSS/BLUE SHIELD

## 2024-03-26 DIAGNOSIS — Z113 Encounter for screening for infections with a predominantly sexual mode of transmission: Principal | ICD-10-CM

## 2024-03-26 DIAGNOSIS — Z5181 Encounter for therapeutic drug level monitoring: Principal | ICD-10-CM

## 2024-03-26 DIAGNOSIS — B20 Human immunodeficiency virus [HIV] disease: Principal | ICD-10-CM

## 2024-03-26 DIAGNOSIS — Z79899 Other long term (current) drug therapy: Principal | ICD-10-CM

## 2024-03-26 DIAGNOSIS — Z9189 Other specified personal risk factors, not elsewhere classified: Principal | ICD-10-CM

## 2024-03-26 LAB — BASIC METABOLIC PANEL
ANION GAP: 13 mmol/L (ref 5–14)
BLOOD UREA NITROGEN: 12 mg/dL (ref 9–23)
BUN / CREAT RATIO: 15
CALCIUM: 9.1 mg/dL (ref 8.7–10.4)
CHLORIDE: 106 mmol/L (ref 98–107)
CO2: 22.6 mmol/L (ref 20.0–31.0)
CREATININE: 0.8 mg/dL (ref 0.73–1.18)
EGFR CKD-EPI (2021) MALE: >90 mL/min/1.73m2 (ref >=60)
GLUCOSE RANDOM: 98 mg/dL (ref 70–179)
POTASSIUM: 3.7 mmol/L (ref 3.4–4.8)
SODIUM: 142 mmol/L (ref 135–145)

## 2024-03-26 LAB — CBC W/ AUTO DIFF
BASOPHILS ABSOLUTE COUNT: 0 10*9/L (ref 0.0–0.1)
BASOPHILS RELATIVE PERCENT: 0.4 %
EOSINOPHILS ABSOLUTE COUNT: 0.1 10*9/L (ref 0.0–0.5)
EOSINOPHILS RELATIVE PERCENT: 0.8 %
HEMATOCRIT: 45 % (ref 39.0–48.0)
HEMOGLOBIN: 15.8 g/dL (ref 12.9–16.5)
LYMPHOCYTES ABSOLUTE COUNT: 1.9 10*9/L (ref 1.1–3.6)
LYMPHOCYTES RELATIVE PERCENT: 19.3 %
MEAN CORPUSCULAR HEMOGLOBIN CONC: 35.1 g/dL (ref 32.0–36.0)
MEAN CORPUSCULAR HEMOGLOBIN: 31.6 pg (ref 25.9–32.4)
MEAN CORPUSCULAR VOLUME: 90 fL (ref 77.6–95.7)
MEAN PLATELET VOLUME: 8.4 fL (ref 6.8–10.7)
MONOCYTES ABSOLUTE COUNT: 0.8 10*9/L (ref 0.3–0.8)
MONOCYTES RELATIVE PERCENT: 7.8 %
NEUTROPHILS ABSOLUTE COUNT: 7 10*9/L (ref 1.8–7.8)
NEUTROPHILS RELATIVE PERCENT: 71.7 %
PLATELET COUNT: 235 10*9/L (ref 150–450)
RED BLOOD CELL COUNT: 5 10*12/L (ref 4.26–5.60)
RED CELL DISTRIBUTION WIDTH: 13.3 % (ref 12.2–15.2)
WBC ADJUSTED: 9.8 10*9/L (ref 3.6–11.2)

## 2024-03-26 LAB — LYMPH MARKER LIMITED,FLOW
ABSOLUTE CD3 CNT: 1444 {cells}/uL (ref 915–3400)
ABSOLUTE CD4 CNT: 779 {cells}/uL (ref 510–2320)
ABSOLUTE CD8 CNT: 608 {cells}/uL (ref 180–1520)
CD3% (T CELLS): 76 % (ref 61–86)
CD4% (T HELPER): 41 % (ref 34–58)
CD4:CD8 RATIO: 1.3 (ref 0.9–4.8)
CD8% T SUPPRESR: 32 % (ref 12–38)

## 2024-03-26 LAB — AST: AST (SGOT): 32 U/L (ref <=34)

## 2024-03-26 LAB — BILIRUBIN, TOTAL: BILIRUBIN TOTAL: 0.6 mg/dL (ref 0.3–1.2)

## 2024-03-26 LAB — ALT: ALT (SGPT): 44 U/L (ref 10–49)

## 2024-03-26 NOTE — Unmapped (Signed)
 Name: Kevin [name]  Date: 03/26/2024  Address: [address]  [place] [address]   [place] of Residence:  [facility]  Phone: [phone]     Started assessment with patient options: in clinic    Is this the same address for mailing? Yes  If no, Mailing Address is:     What is your preferred method of contact? Text (Requires Texting agreement)    Is there anyone that you would want to add as your personal contact? No; if yes, please use SmartPhrase RWPersonalContactInfo to gather their contacts information.      Housing Status  Stable/Permanent; if so, what is their housing type: Renting and living - room, house, or apartment    [employer] - Individual     Marital Status  Single    Tax Press photographer Status  Single    Employment Status  Employed Full Time    Income  Salary/Wages    If no or low income, how are you meeting your basic needs?  Not Applicable    List Tax Household Members including relationship to you:   N/A    Someone in my household receives: Not Applicable (for household members)  Specify who: N/A    Do you or anyone in your home have income adjustments? No    If yes, which adjustments do you have? N/A      Medication Access/Barriers: none    Do you have a current diagnosis for Hepatitis C?  Lab Results   Component Value Date    HEPCAB Reactive (A) 03/12/2019    HCVRNA Not Detected 03/27/2022       Federal Marketplace Eligibility Assessment  Patient has Marketplace coverage.     Patient given ACA education if they qualified based on answers to questions above.     MyChart  Do you have an active MyChart account? Yes     If MyChart is not set up, informed patient on how to set up MyChart No    Patient was informed of the following programs;   N/A    The following applications/handouts were given to patient:   N/A    The following forms were also started with the patient:   N/A    Medicaid:  N/A      Ryan White/HMAP application status: Incomplete; patient needs to send 4 paystubs    Patient is applying for [employer] on Charges Only     Additional Comments: patient will submit income documents via text message.     ____________________________________________________________________    The patient provides verbal consent and agrees to the following:    RW Consent  I agree to notify the interviewer within 30 days about any changes to my address, financial resources, expenses, family situation, or health insurance coverage that may impact my eligibility for Department payment programs. I certify that the information I have provided is accurate and complete to the best of my knowledge. I understand that information provided may be verified by a state reviewer, and I agree to submit any necessary financial records required for this review. I also understand that my employer may be asked to verify information concerning my income.    Caps on Charges Consent  The [employer] Program requires that both insured and uninsured individuals be charged no more than a specified maximum amount in a calendar year, based on their individual income.          Madinah Cathcart-Rowe  Benefits Counselor  Time of  Intervention: 15 mins

## 2024-03-26 NOTE — Unmapped (Addendum)
 INFECTIOUS DISEASES CLINIC  [address]  [place], [place]  [address]  P [phone]  F [phone]     Assessment & Plan  HIV (dx'd 02/2019, nadir CD4 241 / 20% in 02/2019)  - chronic, stable    - HIV is well-controlled with Biktarvy. Viral load is undetectable, and CD4 count was 840 as of June 2024. He is adherent to [facility] and understands the treatment plan.   Discussed Cabenuva injections but he prefers to avoid needles. May be interested in injectable options in the future if they have less frequent dosing.    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 Multiple episodes over the past few years, with the most recent requiring incision and drainage. ENT has scheduled a CT scan to evaluate further .   - Perform CT scan on May 17 to evaluate the submandibular region  - Follow up with ENT based on CT results    Depression and anxiety  - Depression and anxiety managed with Venlafaxine 75 mg daily. He reports improvement on this medication compared to previous treatments. Mood has improved since the last visit.  - Continue Venlafaxine 75 mg daily    Weight management  -Experienced weight fluctuations, with a recent weight of 183 lbs. Insurance denied coverage for [employer], and alternative options such as [employer] were discussed. He has lost weight since January but gained some back recently. Dietary habits and potential other weight management medications were discussed.  - Consider referral to a weight management clinic in the future if desired.  - He will re-establish with our dietician, Kevin [name].     Vaping  - He vapes but does not consider it a significant issue. He is aware of the risks but is not currently interested in quitting.  - Will plan to continue to address in the future and offer support and resources for quitting vaping if interested       Sexual health & secondary prevention  -   Sexually active with men. Updating STI testing today.    Lab Results   Component Value Date    RPR Nonreactive 12/04/2023    RPR Nonreactive 04/30/2023    CTNAA Negative 12/04/2023    CTNAA Negative 12/04/2023    CTNAA Negative 12/04/2023    GCNAA Negative 12/04/2023    GCNAA Negative 12/04/2023    GCNAA Negative 12/04/2023    SPECSOURCE Rectum 12/04/2023    SPECSOURCE Throat 12/04/2023    SPECSOURCE Urine 12/04/2023       Health maintenance     Metabolic conditions  Wt Readings from Last 5 Encounters:   03/26/24 83 kg (183 lb)   03/10/24 81.2 kg (179 lb)   02/27/24 80.7 kg (178 lb)   12/04/23 85.3 kg (188 lb)   04/30/23 83 kg (183 lb)     Lab Results   Component Value Date    CREATININE 0.97 12/04/2023    GLUCOSEU Negative 03/12/2019    GLU 80 12/04/2023    A1C 5.0 12/04/2023    ALT 44 12/04/2023    ALT 37 04/30/2023    ALT 40 10/23/2022     #  Kidney health - SCr and eGFR  # Bone health - assessment not yet needed (under age 85)  # Diabetes assessment - check random glucose  # NAFLD assessment - suspicion for MAFLD low    Communicable diseases  Lab Results   Component Value Date    QFTTBGOLD Negative 03/12/2019    HEPAIGG Nonreactive 03/12/2019    HEPBSAB Nonreactive 03/27/2022    HEPCAB Reactive (A) 03/12/2019    HCVRNA Not Detected 03/27/2022     # TB screening - no longer needed; negative IGRA, low risk  # Hepatitis screening - meets criteria for annual HCV (re)screening per DHHS & HIVMA    Cancer screening  Lab Results   Component Value Date    FINALDX  04/06/2020     A: Soft tissue, left neck, excision  - Epidermal cyst, pilar type    This electronic signature is attestation that the pathologist personally reviewed the submitted material(s) and the final diagnosis reflects that evaluation.       Cardiovascular disease  Lab Results   Component Value Date    CHOL 209 (H) 12/04/2023    HDL 33 (L) 12/04/2023    LDL 124 (H) 12/04/2023    NONHDL 176 (H) 12/04/2023    TRIG 260 (H) 12/04/2023     Immunization History   Administered Date(s) Administered    COVID-19 VACCINE,MRNA(MODERNA)(PF) 08/03/2020       Immunizations needed - patient declines recommended immunization(s) Would be due for: Covid, Pneumonia, Hep A, Hep B, Tetanus, HPV      I personally spent 38 minutes face-to-face and non-face-to-face in the care of this patient, which includes all pre, intra, and post visit time on the date of service.    Disposition  Next appointment: 5-6 months    Renella Cart, PA-C  [facility]   [address]  [place], [place].  [address]  Phone: [phone]   Fax: [phone]      Subjective        HPI  In addition to details in A&P above:    History of Present Illness  Kevin [name] is a 33 year old male with well-controlled HIV who presents with a recurrent submandibular abscess. He was referred by an ENT doctor for further evaluation of his recurrent submandibular abscess.    He has a history of recurrent submandibular abscesses, with the most recent episode on February 27, 2024, leading to an emergency room visit. This is the second or third occurrence, with previous episodes occurring about a year and a half to two years apart. The abscess typically reduces in size with antibiotics but has required incision and drainage in the past. Cultures from the first incision and drainage grew Staphylococcus aureus, while the culture from April 9 grew mixed anaerobes. No fever or chills. He maintains a good appetite and sleep.    His HIV is well-controlled, with an undetectable viral load as of January 2025 and a CD4 count of 840 as of June 2024. He is adherent to [facility] for HIV management and has a sufficient supply of the medication.    He has a history of depression and is currently taking Effexor, which has been effective in improving his mood compared to previous medications.    He has been unable to obtain [facility] for weight management due to insurance denial. Despite this, he has experienced some weight fluctuation, losing weight between January and April 2025, but gaining some back by May 2025.  He vapes but has not smoked cigarettes for eight or nine years. He is concerned about the impact of vaping on his throat.    He has a history of using [employer] and is concerned about its coverage, particularly after being denied coverage for [facility]. He is considering exploring other insurance options for better coverage.      Past Medical History:   Diagnosis Date    Anal fistula     History of COVID-19 2021    History of syphilis     HIV disease        Social History  Housing - in house in [place].     School / Work - He finished Therapist, art. He works at a [employer]    Social History     Tobacco Use    Smoking status: Former     Current packs/day: 0.00     Types: Cigarettes     Quit date: 2022     Years since quitting: 3.3    Smokeless tobacco: Never   Substance Use Topics    Alcohol use: Yes     Alcohol/week: 16.0 standard drinks of alcohol     Types: 8 Glasses of wine, 8 Shots of liquor per week     Comment: weekends       Medications and Allergies  He has a current medication list which includes the following prescription(s): biktarvy, venlafaxine, and wegovy.    Allergies: Doxycycline      Family History  His family history includes Alzheimer's disease in his maternal grandfather and paternal grandfather; Crohn's disease in his father; Diabetes in his maternal grandmother; Heart failure in his maternal grandmother; Lymphoma in his paternal grandmother.           Objective      BP 112/80 (BP Site: L Arm, BP Position: Sitting, BP Cuff Size: Large)  - Pulse 82  - Temp 36.4 ??C (97.5 ??F) (Temporal)  - Ht 167.6 cm (5' 5.98)  - Wt 83 kg (183 lb)  - BMI 29.55 kg/m??      Physical Exam  Vitals reviewed.   Constitutional:       Appearance: Normal appearance.   HENT:      Mouth/Throat:      Mouth: Mucous membranes are moist.      Pharynx: Oropharynx is clear.   Cardiovascular:      Rate and Rhythm: Normal rate and regular rhythm.   Pulmonary:      Effort: Pulmonary effort is normal.      Breath sounds: Normal breath sounds.   Lymphadenopathy:      Head:      Right side of head: No submental, submandibular, tonsillar, preauricular, posterior auricular or occipital adenopathy.      Left side of head: No submental, submandibular, tonsillar, preauricular, posterior auricular or occipital adenopathy.      Comments: He has a 2cm round area of slight fullness without any discrete masses under his chin with alopecia. Non-tender.   Skin:     General: Skin is warm and dry.   Neurological:      Mental Status: He is alert.   Psychiatric:         Mood and Affect: Mood normal.         Behavior: Behavior normal.         Thought Content: Thought content normal.         Judgment: Judgment normal.

## 2024-03-27 LAB — HIV RNA, QUANTITATIVE, PCR
HIV RNA QNT RSLT: DETECTED — AB
HIV RNA: <20 {copies}/mL — ABNORMAL HIGH (ref <0)

## 2024-03-27 LAB — SYPHILIS SCREEN: SYPHILIS RPR SCREEN: NONREACTIVE

## 2024-04-05 ENCOUNTER — Inpatient Hospital Stay: Admit: 2024-04-05 | Discharge: 2024-04-06 | Payer: BLUE CROSS/BLUE SHIELD

## 2024-04-05 MED ADMIN — iohexol (OMNIPAQUE) 350 mg iodine/mL solution 100 mL: 100 mL | INTRAVENOUS | @ 15:00:00 | Stop: 2024-04-05

## 2024-04-07 NOTE — Unmapped (Signed)
 Patient will return for needle I&D next week. He does not prefer to have the wound packed. I discussed long term antibiotics and consultation with ID. He is amenable.         Violeta Grey, MD  Assistant Professor  Otolaryngology - Head and Neck Surgery

## 2024-04-10 ENCOUNTER — Ambulatory Visit: Admit: 2024-04-10 | Discharge: 2024-04-11 | Payer: BLUE CROSS/BLUE SHIELD

## 2024-04-10 DIAGNOSIS — K6289 Other specified diseases of anus and rectum: Principal | ICD-10-CM

## 2024-04-10 DIAGNOSIS — L0291 Cutaneous abscess, unspecified: Principal | ICD-10-CM

## 2024-04-10 MED ORDER — AMOXICILLIN 875 MG-POTASSIUM CLAVULANATE 125 MG TABLET
ORAL_TABLET | Freq: Two times a day (BID) | ORAL | 0 refills | 7.00000 days | Status: CP
Start: 2024-04-10 — End: 2024-04-17

## 2024-04-10 NOTE — Unmapped (Addendum)
 Assessment/Plan:      Kevin [name], a 33 y.o. male seen today for urgent evaluation of abscess.    Plan:    Recurrent Abscess   - History of recurrent abscess under left side of chin requiring multiple I&Ds and courses of outpatient antibiotics.   - Seen by ENT with recommendation for CT neck, which he recently completed and showed a superficial rim-enhancing collection in the left submental superficial soft tissues measuring 1.8 x 0.6 x 2.6 cm, concerning for an abscess. No intraoral or deep soft tissue extension.   - On exam today, he has a 1.5cm x 1cm non-tender, non-erythematous indurated area without fluctuance. It does not appear to be amenable to I&D.   - Given recent culture grew mixed anaerobes, will provide Augmentin 875-125mg  BID x 7 days.  - Advised to keep area clean, avoid shaving or picking/scratching, apply warm compresses.  - Given repeat abscesses, I wonder if he may have a cyst or other structural change that increases his risk of recurrence. Will reach out to his ENT doctor to determine if surgical intervention is needed.  -     amoxicillin-clavulanate (AUGMENTIN) 875-125 mg per tablet; Take 1 tablet by mouth every twelve (12) hours for 7 days.    Perirectal discomfort  - He reports 1 day of rectal discomfort. On exam, he has an approximately 1 cm slightly tender, erythematous perianal nodule.   - He has a history of anal fistula s/p anal fistulotomy in 2024.   - Recommend follow-up with colorectal surgeon for additional evaluation.       HIV  Well controlled on Biktarvy   Fills ART via private insurance.   Lab Results   Component Value Date    ACD4 779 03/26/2024    HIVCP <20 (H) 03/26/2024    HIVRS Detected (A) 03/26/2024     Continue current therapy.  No labs today.  Encouraged continued excellent ARV adherence.      Disposition  Return to clinic 3-4 months or sooner if needed.    Renella Cart, PA-C  [facility]   [address]  [place], [place]. [address]  Phone: [phone]   Fax: [phone]     I personally spent 25 minutes face-to-face and non-face-to-face in the care of this patient, which includes all pre, intra, and post visit time on the date of service.  All documented time was specific to the E/M visit and does not include any procedures that may have been performed.      Subjective:      Chief Complaint   Abscess    HPI  Urgent visit for Kevin [name], a 33 y.o. male presenting for the above concerns.    He has a recurrent mass under the left side of his chin that has required multiple I&Ds and antibiotic therapy. He was seen by ENT 4/21 with recommendation for CT neck w contrast to evaluate further. Imaging was completed 04/05/24 and showed a superficial rim-enhancing collection in the left submental superficial soft tissues measuring 1.8 x 0.6 x 2.6 cm, concerning for an abscess. Neoplasm is less favored. There is no intraoral or deep soft tissue extension. Mildly enlarged level 1A lymphadenopathy, likely reactive.     Today, he reports that the area in his chin was the size of a golfball last month prior to I&D, and then decreased in size but never went away. Around 4 days ago he felt like it was enlarging slightly and felt squishy which  it hadn't before. It is not painful. He is not shaving the area (there is currently no hair growth there) and has been applying foundation but otherwise leaving the area alone. It has not grown further over the last few days. He was Recommended to have an I & D by his ENT provider, but is concerned about being able to pack it himself.        Prior history:  02/24/2024- Started on Bactrim at [facility] 4/6. Despite this, had worsening of symptoms. I&D performed in ER. Culture grew 3+ mixed anaerobes.     10/01/2022- Seen in the ER 10/01/24. Culture grew staph lugdunensis   Susceptibility                Staphylococcus lugdunensis       MIC SUSCEPTIBILITY RESULT KIRBY BAUER       Clindamycin     Susceptible Doxycycline     Susceptible       Erythromycin     Resistant       Fluoroquinolone     No Interpretation 1       Gentamicin     Susceptible 2       Nafcillin     Resistant 3       Trimethoprim + Sulfamethoxazole     Susceptible       Vancomycin 2 Susceptible                 10/2020- Seen at ID with week history of tender nodule under his chin. Given Doxycyline. No I&D or cultures.     03/2020- Recurrent left neck mass. Seen by ENT. Underwent excision- pathology showed epidermal cyst.      Past Medical History:   Diagnosis Date    Anal fistula     History of COVID-19 2021    History of syphilis     HIV disease        Medications and Allergies   Reviewed and updated today. See bottom of this visit's encounter summary for details.  Current Outpatient Medications on File Prior to Visit   Medication Sig    bictegrav-emtricit-tenofov ala (BIKTARVY) 50-200-25 mg tablet Take 1 tablet by mouth daily.    venlafaxine (EFFEXOR) 75 MG tablet Take 1 tablet (75 mg total) by mouth daily.    WEGOVY 0.25 MG/0.5 ML SUBCUTANEOUS PEN INJECTOR Inject 0.25 mg under the skin every seven (7) days.     No current facility-administered medications on file prior to visit.     Allergies   Allergen Reactions    Doxycycline Other (See Comments)     GI upset     Social History  Social History     Tobacco Use    Smoking status: Former     Current packs/day: 0.00     Types: Cigarettes     Quit date: 2022     Years since quitting: 3.4    Smokeless tobacco: Never   Substance Use Topics    Alcohol use: Yes     Alcohol/week: 16.0 standard drinks of alcohol     Types: 8 Glasses of wine, 8 Shots of liquor per week     Comment: weekends     Review of Systems  As per HPI. Remainder of 10 systems reviewed, negative.      Objective:      BP 122/90 (BP Site: R Arm, BP Position: Sitting, BP Cuff Size: Medium)  - Pulse 87  - Temp 36.7 ??C (98.1 ??F) (Oral)  -  Resp 18  - Ht 167.6 cm (5' 5.98)  - Wt 83.9 kg (185 lb)  - BMI 29.88 kg/m??     Physical Exam  Vitals reviewed.   Constitutional:       Appearance: Normal appearance.   HENT:      Head:      Comments: No obvious dental infection.      Mouth/Throat:      Mouth: Mucous membranes are moist.      Dentition: Normal dentition. No gingival swelling, dental abscesses or gum lesions.      Pharynx: Oropharynx is clear. No posterior oropharyngeal erythema.   Neck:     Neurological:      Mental Status: He is alert.         Laboratory Data  Reviewed in Epic today, using Synopsis and Chart Review filters.    Lab Results   Component Value Date    CREATININE 0.80 03/26/2024    QFTTBGOLD Negative 03/12/2019    HEPCAB Reactive (A) 03/12/2019    HCVRNA Not Detected 03/27/2022    CHOL 209 (H) 12/04/2023    HDL 33 (L) 12/04/2023    LDL 124 (H) 12/04/2023    NONHDL 176 (H) 12/04/2023    TRIG 260 (H) 12/04/2023    A1C 5.0 12/04/2023    FINALDX  04/06/2020     A: Soft tissue, left neck, excision  - Epidermal cyst, pilar type    This electronic signature is attestation that the pathologist personally reviewed the submitted material(s) and the final diagnosis reflects that evaluation.

## 2024-04-11 NOTE — Unmapped (Signed)
 Submitted RW/Caps on Charges application into [employer].      Patient completed RW paperwork. Patient is eligible for RW B&C grant services and Caps on Charges. IPL= 83%;FPL= 83%. Expires: 11/19/2024      [facility]  Benefits Counselor  Time of Intervention: 5 mins

## 2024-04-17 DIAGNOSIS — L0291 Cutaneous abscess, unspecified: Principal | ICD-10-CM

## 2024-04-17 MED ORDER — AMOXICILLIN 875 MG-POTASSIUM CLAVULANATE 125 MG TABLET
ORAL_TABLET | Freq: Two times a day (BID) | ORAL | 0 refills | 7.00000 days | Status: CP
Start: 2024-04-17 — End: 2024-04-24

## 2024-04-17 NOTE — Unmapped (Signed)
 Kevin [name], I tried eating with the medication today but am still feeling nauseous with it, would it be possible to get the prescription for the other antibiotic sent to     [facility]   [address]   [place], [place]  [address]   [place]      It???s the closest pharmacy to my house so I don???t have to drive back to [place] just for the medication, thank you so much for all you do !!       Prescription sent to preferred pharmacy

## 2024-04-18 MED ORDER — CLINDAMYCIN HCL 300 MG CAPSULE
ORAL_CAPSULE | Freq: Three times a day (TID) | ORAL | 0 refills | 7.00000 days | Status: CP
Start: 2024-04-18 — End: 2024-04-25

## 2024-05-16 NOTE — Unmapped (Signed)
 [facility] Special[facility] and Home Delivery Pharm[facility] Refill Coordination Note    Specialty Medication(s) to be Shipped:   Infectious Disease: Biktarvy     Other medication(s) to be shipped: No additional medications requested for fill at this time     Kevin [name], DOB: 06/04/1991  Phone: [phone] (home)       All above HIPAA information was verified with patient.     Was a Nurse, learning disability used for this call? No    Completed refill call assessment today to schedule patient's medication shipment from the [facility] and Home Delivery Pharmacy  [phone]).  All relevant notes have been reviewed.     Specialty medication(s) and dose(s) confirmed: Regimen is correct and unchanged.   Changes to medications: Kevin [name] reports no changes at this time.  Changes to insurance: No  New side effects reported not previously addressed with a pharmacist or physician: None reported  Questions for the pharmacist: No    Confirmed patient received a Conservation officer, historic buildings and a Surveyor, mining with first shipment. The patient will receive a drug information handout for each medication shipped and additional FDA Medication Guides as required.       DISEASE/MEDICATION-SPECIFIC INFORMATION        N/A    SPECIALTY MEDICATION ADHERENCE     Medication Adherence    Patient reported X missed doses in the last month: 0  Specialty Medication: BIKTARVY  50-200-25 mg tablet (bictegrav-emtricit-tenofov ala)  Patient is on additional specialty medications: No              Were doses missed due to medication being on hold? No    BIKTARVY  50-200-25 mg tablet (bictegrav-emtricit-tenofov ala): 4 days of medicine on hand       REFERRAL TO PHARMACIST     Referral to the pharmacist: Not needed      [facility]     Shipping address confirmed in Epic.     Cost and Payment: Patient has a $0 copay, payment information is not required.    Delivery Scheduled: Yes, Expected medication delivery date: 05/20/24.     Medication will be delivered via Same Day Courier to the prescription address in Epic WAM.    Kevin [name]   [facility] Delivery [facility]  Specialty Technician

## 2024-05-20 MED FILL — BIKTARVY 50 MG-200 MG-25 MG TABLET: ORAL | 30 days supply | Qty: 30 | Fill #3

## 2024-07-30 NOTE — Unmapped (Signed)
 [facility] Special[facility] and Home Delivery Pharmacy Refill Coordination Note    Specialty Medication(s) to be Shipped:   Infectious Disease: Biktarvy     Other medication(s) to be shipped: No additional medications requested for fill at this time    Specialty Medications not needed at this time: N/A     Kevin [name], DOB: 1991/06/10  Phone: [phone] (home)       All above HIPAA information was verified with patient.     Was a Nurse, learning disability used for this call? No    Completed refill call assessment today to schedule patient's medication shipment from the [facility] and Home Delivery Pharmacy  [phone]).  All relevant notes have been reviewed.     Specialty medication(s) and dose(s) confirmed: Regimen is correct and unchanged.   Changes to medications: Kevin [name] reports no changes at this time.  Changes to insurance: No  New side effects reported not previously addressed with a pharmacist or physician: None reported  Questions for the pharmacist: No    Confirmed patient received a Conservation officer, historic buildings and a Surveyor, mining with first shipment. The patient will receive a drug information handout for each medication shipped and additional FDA Medication Guides as required.       DISEASE/MEDICATION-SPECIFIC INFORMATION        N/A    SPECIALTY MEDICATION ADHERENCE     Medication Adherence    Patient reported X missed doses in the last month: 0  Specialty Medication: Biktarvy  50-200-25mg   Patient is on additional specialty medications: No  Informant: patient     Were doses missed due to medication being on hold? No    Biktarvy  50-200-25  mg: 7 days of medicine on hand       REFERRAL TO PHARMACIST     Referral to the pharmacist: Not needed      [facility]     Shipping address confirmed in Epic.     Cost and Payment: Patient has a $0 copay, payment information is not required.    Delivery Scheduled: Yes, Expected medication delivery date: 08/01/24.     Medication will be delivered via Next Day Courier to the prescription address in Epic [place].    Kevin [name] M Santer Torres   [place] [facility]  Specialty Technician

## 2024-07-31 MED FILL — BIKTARVY 50 MG-200 MG-25 MG TABLET: ORAL | 30 days supply | Qty: 30 | Fill #4

## 2024-08-25 DIAGNOSIS — A630 Anogenital (venereal) warts: Principal | ICD-10-CM

## 2024-08-25 NOTE — Unmapped (Signed)
 Patient Name: Kevin [name]  Patient Age: 33 y.o.  Encounter Date: 08/25/2024    REFERRING PHYSICIAN:  Celestino Camellia Cough, PA  [address]  [address]  [place],  [place] [address]    CONSULTING PHYSICIANS:  Patient Care Team:  Celestino Camellia Cough, [place] as PCP - General (Infectious Diseases)  Celestino Camellia Cough, PA as Physician Assistant (Infectious Diseases)    PRIMARY CARE PROVIDER:  Celestino Camellia Cough, [place]    DIAGNOSIS: h/o anal fistula s/p fistulotomy with [facility] 09/2022        FOLLOWUP NOTE:    Patient has a history of an anal fistula treated by Dr. Derinda 2 years ago.  He is new to me but is making this appointment because he thought he noticed an abnormality near his fistula wound a few months ago.  Since, things have normalized but he kept the appointment.        REVIEW OF SYSTEMS: No chest pain, shortness of breath, nausea and vomiting    ALLERGIES:  is allergic to doxycycline .    MEDICATIONS:  Current Medications[1]      Objective :    Vital Signs for this encounter:  BSA: 2 meters squared  BP 113/78 (BP Site: L Wrist, BP Position: Sitting)  - Pulse 99  - Wt 85.7 kg (189 lb)  - BMI 30.52 kg/m??       General Appearance:  No acute distress, well appearing and well nourished.   HEENT:  Normocephalic, atraumatic, anicteric sclera   Abdomen:    Heart:  Lungs: Soft  Rate within normal limits  Non-labored breathing   Musculoskeletal: Extremities without clubbing, cyanosis, or edema.   Skin: Skin color, texture, turgor normal, no rashes or lesions.   Neurologic: No motor abnormalities noted.  Sensation grossly intact.   Rectal: Perianal exam reveals a right fistula wound.  It is well-healed.  There is no irregularities around it.  On the left perianal skin there is a wart.  Digital exam was performed and there is no irregularities.  Anoscopy is performed there is a leukoplakia area anteriorly.        DIAGNOSTIC STUDIES:   None      ASSESSMENT:      1.  Left lateral perianal wart  2.  Intra anal anterior leukoplakia  3.  HIV  4.  H/o anorectal warts and syphilis        PLAN:    I have recommended that we bring him back another day in the office for removal of this wart under local anesthesia.  At the same time I will take another look with a anoscopy and likely take a small biopsy of this anterior leukoplakia.  He will sit with Zana today to look at appointments for this procedure         [1]   Current Outpatient Medications   Medication Sig Dispense Refill    bictegrav-emtricit-tenofov ala (BIKTARVY ) 50-200-25 mg tablet Take 1 tablet by mouth daily. 30 tablet 5    venlafaxine  (EFFEXOR ) 75 MG tablet Take 1 tablet (75 mg total) by mouth daily. 30 tablet 5    WEGOVY  0.25 MG/0.5 ML SUBCUTANEOUS PEN INJECTOR Inject 0.25 mg under the skin every seven (7) days. 2 mL 0     No current facility-administered medications for this visit.

## 2024-08-27 NOTE — Unmapped (Signed)
 The [facility] has made a second and final attempt to reach this patient to refill the following medication:BIKTARVY  50-200-25 mg tablet (bictegrav-emtricit-tenofov ala).      We have left voicemails on the following phone numbers: [phone], have sent a MyChart message, have sent a text message to the following phone numbers: [phone], and have sent a Mychart questionnaire..    Dates contacted: 08/22/2024  08/27/2024  Last scheduled delivery: 07/31/2024    The patient may be at risk of non-compliance with this medication. The patient should call the [facility] at [phone]  Option 4, then Option 4: Infectious Disease, Transplant to refill medication.    Kevin [name]

## 2024-09-04 NOTE — Unmapped (Signed)
 [facility] Specialty and Home Delivery Pharmacy Refill Coordination Note    Specialty Medication(s) to be Shipped:   Infectious Disease: BIKTARVY  50-200-25 mg tablet (bictegrav-emtricit-tenofov ala)    Other medication(s) to be shipped: No additional medications requested for fill at this time    Specialty Medications not needed at this time: N/A     Kevin [name], DOB: January 26, 1991  Phone: [phone] (home)       All above HIPAA information was verified with patient.     Was a Nurse, learning disability used for this call? No    Completed refill call assessment today to schedule patient's medication shipment from the [facility] and Home Delivery Pharmacy  [phone]).  All relevant notes have been reviewed.     Specialty medication(s) and dose(s) confirmed: Regimen is correct and unchanged.   Changes to medications: Sora reports no changes at this time.  Changes to insurance: No  New side effects reported not previously addressed with a pharmacist or physician: None reported  Questions for the pharmacist: No    Confirmed patient received a Conservation officer, historic buildings and a Surveyor, mining with first shipment. The patient will receive a drug information handout for each medication shipped and additional FDA Medication Guides as required.       DISEASE/MEDICATION-SPECIFIC INFORMATION        N/A    SPECIALTY MEDICATION ADHERENCE     Medication Adherence    Patient reported X missed doses in the last month: 0  Specialty Medication: BIKTARVY  50-200-25 mg tablet (bictegrav-emtricit-tenofov ala)  Patient is on additional specialty medications: No  Informant: patient              Were doses missed due to medication being on hold? No    BIKTARVY  50-200-25 mg tablet (bictegrav-emtricit-tenofov ala): 10 days of medicine on hand       REFERRAL TO PHARMACIST     Referral to the pharmacist: Not needed      [facility]     Shipping address confirmed in Epic.     Cost and Payment: Patient has a $0 copay, payment information is not required.    Delivery Scheduled: Yes, Expected medication delivery date: 10/20.     Medication will be delivered via Same Day Courier to the prescription address in Epic [place]AM.    Kevin [name] [facility]

## 2024-09-04 NOTE — Unmapped (Signed)
 The [facility] and [facility] Delivery Pharm[facility] notified the clinic that they were unable to reach the patient to schedule the delivery of his Biktarvy . Called the patient and he stated that the pharmacy is supposed to call his father to schedule the delivery since his medications go there. I placed him on hold, and got a representative from the [facility]. I informed the representative of the patient's request to have his father be the primary contact for medication delivery. The representative noted that, but patient has hung up when I attempted to transfer. Representative informed me that they will reach out to him again and schedule the delivery.     Duration of service: 10 minutes

## 2024-09-07 MED FILL — BIKTARVY 50 MG-200 MG-25 MG TABLET: ORAL | 30 days supply | Qty: 30 | Fill #5

## 2024-09-29 DIAGNOSIS — B20 Human immunodeficiency virus [HIV] disease: Principal | ICD-10-CM

## 2024-09-29 MED ORDER — BIKTARVY 50 MG-200 MG-25 MG TABLET
ORAL_TABLET | Freq: Every day | ORAL | 0 refills | 30.00000 days | Status: CP
Start: 2024-09-29 — End: ?
  Filled 2024-10-02: qty 30, 30d supply, fill #0

## 2024-09-29 NOTE — Progress Notes (Signed)
 [facility] Specialty and Home Delivery Pharmacy Refill Coordination Note    Specialty Medication(s) to be Shipped:   Infectious Disease: BIKTARVY  50-200-25 mg tablet (bictegrav-emtricit-tenofov ala)    Other medication(s) to be shipped: No additional medications requested for fill at this time    Specialty Medications not needed at this time: N/A     Kevin [name], DOB: Sep 22, 1991  Phone: [phone] (home)       All above HIPAA information was verified with patient's family member, Father [name] .     Was a nurse, learning disability used for this call? No    Completed refill call assessment today to schedule patient's medication shipment from the [facility] and Home Delivery Pharmacy  [phone]).  All relevant notes have been reviewed.     Specialty medication(s) and dose(s) confirmed: Regimen is correct and unchanged.   Changes to medications: Tresean reports no changes at this time.  Changes to insurance: No  New side effects reported not previously addressed with a pharmacist or physician: None reported  Questions for the pharmacist: No    Confirmed patient received a Conservation Officer, Historic Buildings and a Surveyor, Mining with first shipment. The patient will receive a drug information handout for each medication shipped and additional FDA Medication Guides as required.       DISEASE/MEDICATION-SPECIFIC INFORMATION        N/A    SPECIALTY MEDICATION ADHERENCE     Medication Adherence    Patient reported X missed doses in the last month: 0  Specialty Medication: BIKTARVY  50-200-25 mg tablet (bictegrav-emtricit-tenofov ala)  Patient is on additional specialty medications: No  Informant: father              Were doses missed due to medication being on hold? No    BIKTARVY  50-200-25 mg tablet (bictegrav-emtricit-tenofov ala): 7-10 days of medicine on hand       REFERRAL TO PHARMACIST     Referral to the pharmacist: Not needed      [facility]     Shipping address confirmed in Epic.     Cost and Payment: Patient has a $0 copay, payment information is not required.    Delivery Scheduled: Yes, Expected medication delivery date: 11/14.  However, Rx request for refills was sent to the provider as there are none remaining.     Medication will be delivered via Next Day Courier to the prescription address in Epic WAM.    Jeoffrey JAYSON Sherra [facility]

## 2024-09-29 NOTE — Telephone Encounter (Signed)
 Medication Requested: bictegrav-emtricit-tenofov ala (BIKTARVY ) 50-200-25 mg tablet       Future Appointments   Date Time Provider Department Center   10/09/2024  3:00 PM Celestino Camellia Cough, [place] [facility]   10/27/2024  2:45 PM Sadiq, Timothy Suleman, MD [facility]     Per Provider Note: Current Regime    Standing order protocol requirements met?: Yes    Sent to: Pharmacy per protocol    Days Supply Given: 30 days  Number of Refills: 0

## 2024-10-27 DIAGNOSIS — B20 Human immunodeficiency virus [HIV] disease: Principal | ICD-10-CM

## 2024-10-27 MED ORDER — BIKTARVY 50 MG-200 MG-25 MG TABLET
ORAL_TABLET | Freq: Every day | ORAL | 0 refills | 30.00000 days | Status: CP
Start: 2024-10-27 — End: ?
  Filled 2024-10-29: qty 30, 30d supply, fill #0

## 2024-10-27 NOTE — Progress Notes (Signed)
 [facility] Clinical Assessment & Refill Coordination Note    Kevin [name], DOB: [birth year]  Phone: [phone] (home)     All above HIPAA information was verified with patient's family member, father, Kevin [name].     Was a nurse, learning disability used for this call? No    Specialty Medication(s):   Infectious Disease: Biktarvy      Current Medications[1]     Changes to medications: Kevin [name] reports no changes at this time.    Medication list has been reviewed and updated in Epic: Yes    Allergies[2]    Changes to allergies: No    Allergies have been reviewed and updated in Epic: Yes    SPECIALTY MEDICATION ADHERENCE     Biktarvy  50-200-25 mg: 6-8 doses of medicine on hand       Medication Adherence    Patient reported X missed doses in the last month: 0  Specialty Medication: Biktarvy  50-200-25mg  QD  Patient is on additional specialty medications: No  Informant: father          Specialty medication(s) dose(s) confirmed: Regimen is correct and unchanged.     Are there any concerns with adherence? No    Adherence counseling provided? Not needed    CLINICAL MANAGEMENT AND INTERVENTION      Clinical Benefit Assessment:    Do you feel the medicine is effective or helping your condition? Patient declined to answer    Clinical Benefit counseling provided? Labs from 03/2024 show evidence of clinical benefit    Adverse Effects Assessment:    Are you experiencing any side effects? No    Are you experiencing difficulty administering your medicine? No    Quality of Life Assessment:    Quality of Life    Rheumatology  Oncology  Dermatology  Cystic Fibrosis          How many days over the past month did your HIV  keep you from your normal activities? For example, brushing your teeth or getting up in the morning. Patient declined to answer    Have you discussed this with your provider? Not needed    Acute Infection Status:    Acute infections noted within Epic:  No active infections    Patient reported infection: None    Therapy Appropriateness:    Is the medication and dose appropriate considering the patient???s diagnosis, treatment, and disease journey, comorbidities, medical history, current medications, allergies, therapeutic goals, self-administration ability, and access barriers? Yes, therapy is appropriate and should be continued     Clinical Intervention:    Was an intervention completed as part of this clinical assessment? No    DISEASE/MEDICATION-SPECIFIC INFORMATION      N/A    HIV: Not Applicable    HIV ASSOCIATED LABS:     Lab Results   Component Value Date/Time    HIVRS Detected (A) 03/26/2024 10:18 AM    HIVRS Not Detected 12/04/2023 05:03 PM    HIVRS Not Detected 04/30/2023 04:53 PM    HIVCP <20 (H) 03/26/2024 10:18 AM    HIVCP <20 (H) 03/27/2022 04:25 PM    HIVCP 86 (H) 12/23/2019 02:11 PM    ACD4 779 03/26/2024 10:18 AM    ACD4 840 04/30/2023 04:53 PM    ACD4 684 03/27/2022 04:25 PM    HIV See Comment for Test Results (A) 03/11/2019 04:28 PM         PATIENT SPECIFIC NEEDS     Does the patient have any physical, cognitive, or  cultural barriers? No    Is the patient high risk? No    Does the patient require physician intervention or other additional services (i.e., nutrition, smoking cessation, social work)? No    Does the patient have an additional or emergency contact listed in their chart? Yes    SOCIAL DETERMINANTS OF HEALTH     At the [facility], we have learned that life circumstances - like trouble affording food, housing, utilities, or transportation can affect the health of many of our patients.   That is why we wanted to ask: are you currently experiencing any life circumstances that are negatively impacting your health and/or quality of life? Patient declined to answer    Social Drivers of Health     Food Insecurity: No Food Insecurity (03/10/2024)    Hunger Vital Sign     Worried About Running Out of Food in the Last Year: Never true     Ran Out of Food in the Last Year: Never true Tobacco Use: Medium Risk (08/25/2024)    Patient History     Smoking Tobacco Use: Former     Smokeless Tobacco Use: Never     Passive Exposure: Not on file   Transportation Needs: No Transportation Needs (03/10/2024)    PRAPARE - Therapist, Art (Medical): No     Lack of Transportation (Non-Medical): No   Alcohol Use: Not on file   Housing: Low Risk (03/10/2024)    Housing     Within the past 12 months, have you ever stayed: outside, in a car, in a tent, in an overnight shelter, or temporarily in someone else's home (i.e. couch-surfing)?: No     Are you worried about losing your housing?: No   Physical Activity: Not on file   Utilities: Low Risk (03/10/2024)    Utilities     Within the past 12 months, have you been unable to get utilities (heat, electricity) when it was really needed?: No   Stress: Not on file   Interpersonal Safety: Not At Risk (03/10/2024)    Interpersonal Safety     Unsafe Where You Currently Live: No     Physically Hurt by Anyone: No     Abused by Anyone: No   Substance Use: Not on file (09/26/2023)   Intimate Partner Violence: Not on file   Social Connections: Not on file   Financial Resource Strain: Not on file   Health Literacy: Not on file   Internet Connectivity: Not on file       Would you be willing to receive help with any of the needs that you have identified today? Not applicable       SHIPPING     Specialty Medication(s) to be Shipped:   Infectious Disease: Biktarvy     Other medication(s) to be shipped: No additional medications requested for fill at this time    Specialty Medications not needed at this time: N/A     Changes to insurance: No    Cost and Payment: Patient has a $0 copay, payment information is not required.    Delivery Scheduled: Yes, Expected medication delivery date: 10/30/24.  However, Rx request for refills was sent to the provider as there are none remaining.     Medication will be delivered via Next Day Courier to the confirmed prescription address in [facility].    The patient will receive a drug information handout for each medication shipped and additional FDA Medication Guides as required.  Verified that patient has previously received a Conservation Officer, Historic Buildings and a Surveyor, Mining.    The patient or caregiver noted above participated in the development of this care plan and knows that they can request review of or adjustments to the care plan at any time.      All of the patient's questions and concerns have been addressed.    Aleck CHRISTELLA Gaskins, PharmD   [facility] Specialty Pharmacist       [1]   Current Outpatient Medications   Medication Sig Dispense Refill    bictegrav-emtricit-tenofov ala (BIKTARVY ) 50-200-25 mg tablet Take 1 tablet by mouth daily. 30 tablet 0    venlafaxine  (EFFEXOR ) 75 MG tablet Take 1 tablet (75 mg total) by mouth daily. (Patient not taking: Reported on 10/27/2024) 30 tablet 5    WEGOVY  0.25 MG/0.5 ML SUBCUTANEOUS PEN INJECTOR Inject 0.25 mg under the skin every seven (7) days. (Patient not taking: Reported on 10/27/2024) 2 mL 0     No current facility-administered medications for this visit.   [2]   Allergies  Allergen Reactions    Doxycycline  Other (See Comments)     GI upset

## 2024-10-27 NOTE — Telephone Encounter (Signed)
 Medication Requested: biktarvy       Future Appointments   Date Time Provider Department Center   10/28/2024  3:30 PM Celestino Camellia Cough, [place] [facility]   12/01/2024  9:30 AM Sadiq, Timothy Suleman, MD [facility]     Per Provider Note: HIV  Well controlled on Biktarvy      Standing order protocol requirements met?: Yes    Sent to: Pharmacy per protocol    Days Supply Given: 30 days  Number of Refills: 0

## 2024-11-27 DIAGNOSIS — B20 Human immunodeficiency virus [HIV] disease: Principal | ICD-10-CM

## 2024-11-27 MED ORDER — BIKTARVY 50 MG-200 MG-25 MG TABLET
ORAL_TABLET | Freq: Every day | ORAL | 0 refills | 30.00000 days | Status: CP
Start: 2024-11-27 — End: ?
  Filled 2024-11-28: qty 30, 30d supply, fill #0

## 2024-11-27 NOTE — Progress Notes (Signed)
 [name] Refill Coordination Note    Specialty Medication(s) to be Shipped:   Infectious Disease: Biktarvy     Other medication(s) to be shipped: No additional medications requested for fill at this time    Specialty Medications not needed at this time: N/A     Kevin [name], DOB: 1991-06-23  Phone: [phone] (home)       All above HIPAA information was verified with patient's family member, Father.     Was a nurse, learning disability used for this call? No    Completed refill call assessment today to schedule patient's medication shipment from the [facility]  [phone]).  All relevant notes have been reviewed.     Specialty medication(s) and dose(s) confirmed: Regimen is correct and unchanged.   Changes to medications: Kevin [name] reports no changes at this time.  Changes to insurance: No  New side effects reported not previously addressed with a pharmacist or physician: None reported  Questions for the pharmacist: No    Confirmed patient received a Conservation Officer, Historic Buildings and a Surveyor, Mining with first shipment. The patient will receive a drug information handout for each medication shipped and additional FDA Medication Guides as required.       DISEASE/MEDICATION-SPECIFIC INFORMATION        N/A    SPECIALTY MEDICATION ADHERENCE     Medication Adherence    Patient reported X missed doses in the last month: 0  Specialty Medication: BIKTARVY  50-200-25 mg tablet (bictegrav-emtricit-tenofov ala)  Patient is on additional specialty medications: No  Informant: father     Were doses missed due to medication being on hold? No    Biktarvy  50-200-25 mg: 6 days of medicine on hand       Specialty medication is an injection or given on a cycle: No    REFERRAL TO PHARMACIST     Referral to the pharmacist: Not needed      [facility]     Shipping address confirmed in Epic.     Cost and Payment: Patient has a $0 copay, payment information is not required.    Delivery Scheduled: Yes, Expected medication delivery date: 12/01/24.     Medication will be delivered via Next Day Courier to the prescription address in Epic [place].    Kevin [name]   [place] [facility]  Specialty Technician

## 2024-11-27 NOTE — Telephone Encounter (Signed)
 Medication Requested: Biktarvy       Future Appointments   Date Time Provider Department Center   12/01/2024  9:30 AM Sadiq, Evalene Scudder, MD [facility]   12/02/2024  1:30 PM Celestino Camellia Cough, [place] [facility]     Per Provider Note: Well controlled on Biktarvy  04/10/2024    Standing order protocol requirements met?: Yes    Sent to: Pharmacy per protocol    Days Supply Given: 30 days  Number of Refills: 0

## 2024-12-01 ENCOUNTER — Encounter: Admit: 2024-12-01 | Discharge: 2024-12-01 | Payer: BLUE CROSS/BLUE SHIELD | Attending: Surgery | Primary: Surgery

## 2024-12-01 DIAGNOSIS — A630 Anogenital (venereal) warts: Principal | ICD-10-CM

## 2024-12-01 NOTE — Progress Notes (Signed)
 Patient Name: Kevin [name]  Patient Age: 34 y.o.  Encounter Date: 12/01/2024    REFERRING PHYSICIAN:  Referring, Unknown Per Patient  No address on file    CONSULTING PHYSICIANS:  Patient Care Team:  Celestino Camellia Cough, PA as PCP - General (Infectious Diseases)  Celestino Camellia Cough, PA as Physician Assistant (Infectious Diseases)    PRIMARY CARE PROVIDER:  Celestino Camellia Cough, PA    DIAGNOSIS:  intra-anal and perianal lesions        FOLLOWUP NOTE:      Here today for bx of intra-anal lesion and excision of perianal lesion    REVIEW OF SYSTEMS:  no cp, sob, n/v     ALLERGIES:  is allergic to doxycycline .    MEDICATIONS:  Current Medications[1]      Objective :    Vital Signs for this encounter:  BSA: There is no height or weight on file to calculate BSA.  BP 117/92 (BP Site: L Wrist, BP Position: Sitting)  - Pulse 93       General Appearance:  No acute distress, well appearing and well nourished.   HEENT:  Normocephalic, atraumatic, anicteric sclera   Abdomen:    Heart:  Lungs: soft   Rate within normal limits  Non-labored breathing   Musculoskeletal: Extremities without clubbing, cyanosis, or edema.   Skin: Skin color, texture, turgor normal, no rashes or lesions.   Neurologic: No motor abnormalities noted.  Sensation grossly intact.   Rectal: Anoscopy : anterior leukoplakia --> bx  Perianal: right side there is a wart.     Procedure: We cleansed and numbed the skin with 1% lido and excised this perianal wart.  A single 3-0 chromic suture was used to closed the wound        DIAGNOSTIC STUDIES:   none      ASSESSMENT:     1.  Intra-anal leukoplakia (anterior) - biopsied  2.  Perianal wart (right lateral) - excised      PLAN:    He tolerated these procedures fine.  Follow up path, will send him a [employer].  Otherwise follow up in 6 months for surveillance exam         [1]   Current Outpatient Medications   Medication Sig Dispense Refill    bictegrav-emtricit-tenofov ala (BIKTARVY ) 50-200-25 mg tablet Take 1 tablet by mouth daily. 30 tablet 0    venlafaxine  (EFFEXOR ) 75 MG tablet Take 1 tablet (75 mg total) by mouth daily. 30 tablet 5    WEGOVY  0.25 MG/0.5 ML SUBCUTANEOUS PEN INJECTOR Inject 0.25 mg under the skin every seven (7) days. (Patient not taking: Reported on 10/27/2024) 2 mL 0     No current facility-administered medications for this visit.

## 2024-12-02 ENCOUNTER — Encounter: Admit: 2024-12-02 | Discharge: 2024-12-02 | Payer: BLUE CROSS/BLUE SHIELD

## 2024-12-02 DIAGNOSIS — Z113 Encounter for screening for infections with a predominantly sexual mode of transmission: Principal | ICD-10-CM

## 2024-12-02 DIAGNOSIS — Z1159 Encounter for screening for other viral diseases: Principal | ICD-10-CM

## 2024-12-02 DIAGNOSIS — Z79899 Other long term (current) drug therapy: Principal | ICD-10-CM

## 2024-12-02 DIAGNOSIS — Z5181 Encounter for therapeutic drug level monitoring: Principal | ICD-10-CM

## 2024-12-02 DIAGNOSIS — Z0120 Encounter for dental examination and cleaning without abnormal findings: Principal | ICD-10-CM

## 2024-12-02 DIAGNOSIS — Z9189 Other specified personal risk factors, not elsewhere classified: Principal | ICD-10-CM

## 2024-12-02 DIAGNOSIS — B20 Human immunodeficiency virus [HIV] disease: Principal | ICD-10-CM

## 2024-12-02 LAB — CBC W/ AUTO DIFF
BASOPHILS ABSOLUTE COUNT: 0.1 10*9/L (ref 0.0–0.1)
BASOPHILS RELATIVE PERCENT: 0.6 %
EOSINOPHILS ABSOLUTE COUNT: 0.1 10*9/L (ref 0.0–0.5)
EOSINOPHILS RELATIVE PERCENT: 0.9 %
HEMATOCRIT: 44.2 % (ref 39.0–48.0)
HEMOGLOBIN: 15.5 g/dL (ref 12.9–16.5)
LYMPHOCYTES ABSOLUTE COUNT: 2.2 10*9/L (ref 1.1–3.6)
LYMPHOCYTES RELATIVE PERCENT: 22 %
MEAN CORPUSCULAR HEMOGLOBIN CONC: 35 g/dL (ref 32.0–36.0)
MEAN CORPUSCULAR HEMOGLOBIN: 31.2 pg (ref 25.9–32.4)
MEAN CORPUSCULAR VOLUME: 89 fL (ref 77.6–95.7)
MEAN PLATELET VOLUME: 9.1 fL (ref 6.8–10.7)
MONOCYTES ABSOLUTE COUNT: 0.9 10*9/L — ABNORMAL HIGH (ref 0.3–0.8)
MONOCYTES RELATIVE PERCENT: 8.9 %
NEUTROPHILS ABSOLUTE COUNT: 6.6 10*9/L (ref 1.8–7.8)
NEUTROPHILS RELATIVE PERCENT: 67.6 %
PLATELET COUNT: 243 10*9/L (ref 150–450)
RED BLOOD CELL COUNT: 4.96 10*12/L (ref 4.26–5.60)
RED CELL DISTRIBUTION WIDTH: 12.9 % (ref 12.2–15.2)
WBC ADJUSTED: 9.8 10*9/L (ref 3.6–11.2)

## 2024-12-02 LAB — BASIC METABOLIC PANEL
ANION GAP: 13 mmol/L (ref 5–14)
BLOOD UREA NITROGEN: <5 mg/dL — ABNORMAL LOW (ref 9–23)
CALCIUM: 9.6 mg/dL (ref 8.7–10.4)
CHLORIDE: 104 mmol/L (ref 98–107)
CO2: 24.7 mmol/L (ref 20.0–31.0)
CREATININE: 0.89 mg/dL (ref 0.73–1.18)
EGFR CKD-EPI (2021) MALE: >90 mL/min/1.73m2 (ref >=60)
GLUCOSE RANDOM: 97 mg/dL (ref 70–179)
POTASSIUM: 3.7 mmol/L (ref 3.4–4.8)
SODIUM: 142 mmol/L (ref 135–145)

## 2024-12-02 LAB — HIV RNA, QUANTITATIVE, PCR: HIV RNA QNT RSLT: NOT DETECTED

## 2024-12-02 LAB — AST: AST (SGOT): 44 U/L — ABNORMAL HIGH (ref <=34)

## 2024-12-02 LAB — ALT: ALT (SGPT): 83 U/L — ABNORMAL HIGH (ref 10–49)

## 2024-12-02 LAB — BILIRUBIN, TOTAL: BILIRUBIN TOTAL: 0.6 mg/dL (ref 0.3–1.2)

## 2024-12-02 MED ORDER — BIKTARVY 50 MG-200 MG-25 MG TABLET
ORAL_TABLET | Freq: Every day | ORAL | 1 refills | 90.00000 days | Status: CP
Start: 2024-12-02 — End: ?

## 2024-12-02 NOTE — Progress Notes (Signed)
 Name: Kevin [name]  Date: 12/02/2024  Address: [address]  [place] [address]   [place] of Residence:  [facility]  Phone: [phone]      Started assessment with patient options: phone call     Is this the same address for mailing? Yes  If no, Mailing Address is:      What is your preferred method of contact? Phone Call     Is there anyone that you would want to add as your personal contact? No; if yes, please use SmartPhrase RWPersonalContactInfo to gather their contacts information.     N/A     Housing Status  Stable/Permanent; if so, what is their housing type: Renting and living - room, house, or apartment     [employer] - Individual      Marital Status  Single     Tax Press Photographer Status  Single     Employment Status  Employed Full Time     Income  Self-Employed     If no or low income, how are you meeting your basic needs?  Not Applicable     List Tax Household Members including relationship to you:   N/A     Someone in my household receives: Not Applicable (for household members)  Specify who: N/A     Do you or anyone in your home have income adjustments? No     If yes, which adjustments do you have? N/A        Medication Access/Barriers: none     Do you have a current diagnosis for Hepatitis C?        Lab Results   Component Value Date     HEPCAB Reactive (A) 03/12/2019     HCVRNA Not Detected 03/27/2022         Federal Marketplace Eligibility Assessment  Patient has Marketplace coverage.      Patient given [employer] education if they qualified based on answers to questions above.      MyChart  Do you have an active MyChart account? Yes      If MyChart is not set up, informed patient on how to set up MyChart N/A     Patient was informed of the following programs;   N/A     The following applications/handouts were given to patient:   N/A     The following forms were also started with the patient:   N/A     Medicaid:  N/A        [employer] [place]/HMAP application status: Incomplete; patient needs to send proof of income (tax return)     Patient is applying for [employer] on Charges Only      Additional Comments: Copywriter, advertising message for proof of income.                       Bartholome Polite,  Benefits & Eligibility Coordinator  Time of Intervention: 7 minutes

## 2024-12-02 NOTE — Progress Notes (Signed)
 [facility] DIS[facility] CLIN[facility]  [address]  [name], [place]  [address]  P [phone]  F [phone]     Primary care provider: Celestino Camellia Cough, PA    Assessment/Plan:      HIV (dx'd 02/2019, nadir CD4 241 / 20% in 02/2019)  - chronic, stable    - HIV is well-controlled with Biktarvy . Viral load is undetectable, and CD4 count was 770 as of May 2025.   - Not interested in [place]     Current regimen:   Biktarvy  (BIC/FTC/TAF)    ARV hx  Biktarvy  2020- present    Med access through insurance.       Absolute CD4 Count   Date Value Ref Range Status   03/26/2024 779 510 - 2,320 /uL Final     CD4% (T [place])   Date Value Ref Range Status   03/26/2024 41 34 - 58 % Final     HIV RNA Quant Result   Date Value Ref Range Status   03/26/2024 Detected (A) Not Detected Final     HIV RNA   Date Value Ref Range Status   03/26/2024 <20 (H) <0 copies/mL Final        Resistance hx  03/12/2019-  RT/PI/INSTI classes without any significant drug associated mutations.       CD4 count over 300 for >2Y on suppressive ART; no prophylaxis needed; recheck CD4 annually  Discussed ARV adherence  HIV RNA and safety labs (brief return panel)  Continue current therapy  Encouraged continued excellent ARV adherence       Assessment & Plan  Weight gain  BMI 31  -Recent weight gain to198 pounds, attributed to increased food and alcohol intake during the holidays. Previously maintained weight through regular exercise and dietary modifications. Experienced adverse effects with Wegovy  injections, including nausea and vomiting. Interested in exploring Wegovy  Pill as an alternative.  - Encouraged resumption of regular exercise routine, aiming for three times a week.  - Discussed potential use of oral Wegovy  for weight management- he will reach out to his previous weight management clinic.  - Advised on dietary modifications to reduce salt and fried food intake as well as highly processed foods.    Depression and anxiety  History of anxiety and depression. Previously on Effexor  but did not notice significant benefit. Currently managing symptoms without medication. Open to trying other medications if anxiety worsens.  - Monitor anxiety symptoms and consider restarting Effexor  or trying other medications if symptoms worsen.    Anal Wart  -Seen by Dr. Fleurette yesterday and underwent procedure to remove anal wart and biopsy area of intra-anal leukoplakia.   - Biopsy pending.     Need for dental care  No current dental insurance. Discussed potential for dental care through [facility] program. No current dental pain or urgent dental needs reported.  -Referral placed    Vaping  - He vapes but does not consider it a significant issue. He is aware of the risks but is not currently interested in quitting.  - Will plan to continue to address in the future and offer support and resources for quitting vaping if interested    Sexual health & secondary prevention  -   Sexually active with men. Updating STI testing today.    Lab Results   Component Value Date    RPR Nonreactive 03/26/2024    RPR Nonreactive 12/04/2023    CTNAA Negative 03/26/2024    CTNAA Negative 03/26/2024    CTNAA Negative  03/26/2024    GCNAA Negative 03/26/2024    GCNAA Negative 03/26/2024    GCNAA Negative 03/26/2024    SPECSOURCE Rectum 03/26/2024    SPECSOURCE Throat 03/26/2024    SPECSOURCE Urine 03/26/2024       GC/CT NAATs - obtained today from all exposed anatomical site(s)  RPR - for screening obtained today      Health maintenance    Diabetes                       (2024 ADA SoC)  check random glucose   Lab Results   Component Value Date    GLU 98 03/26/2024    A1C 5.0 12/04/2023    A1C 5.0 03/12/2019          Liver health                   (APRI & FIB-4)  obtaining labs to calculate non-invasive scores   Lab Results   Component Value Date    ALT 44 03/26/2024    ALT 44 12/04/2023    ALT 37 04/30/2023    PLT 235 03/26/2024    PLT 240 12/04/2023    PLT 237 04/30/2023          ASCVD risk reduction   (REPRIEVE)  assessment not yet needed (under age 25)    AAA screening  no indication for screening   Lab Results   Component Value Date    LDL 124 (H) 12/04/2023    TRIG 260 (H) 12/04/2023       The ASCVD Risk score (Arnett DK, et al., 2019) failed to calculate for the following reasons:    The 2019 ASCVD risk score is only valid for ages 21 to 19    Note: For patients with SBP <90 or >200, Total Cholesterol <130 or >320, HDL <20 or >100 which are outside of the allowable range, the calculator will use these upper or lower values to calculate the patient???s risk score.           Kidney health  SCr and eGFR   Lab Results   Component Value Date    CREATININE 0.80 03/26/2024    PROTEINUA 30 mg/dL (A) 95/77/7979    GLUCOSEU Negative 03/12/2019          Bone health                    (FRAX)  assessment not yet needed (under age 27)   No results found for: VITDTOTAL       Communicable diseases  TB screening  no longer needed; negative IGRA, low risk    Viral hepatitis screening  meets criteria for annual HCV (re)screening per DHHS & HIVMA  HCV Ab (will reflex to RNA & genotype if reactive)  Hep A and B non-immune, but declines vaccination today.    Quantiferon TB Gold Plus Interpretation   Date Value Ref Range Status   03/12/2019 Negative Negative Final     Hep A IgG   Date Value Ref Range Status   03/12/2019 Nonreactive Nonreactive Final     Hep B Surface Ag   Date Value Ref Range Status   03/27/2022 Nonreactive Nonreactive Final     Hep B S Ab   Date Value Ref Range Status   03/27/2022 Nonreactive Nonreactive, [place] Final     Hepatitis C Ab   Date Value Ref Range Status   03/12/2019 Reactive (A) Nonreactive Final  HCV RNA   Date Value Ref Range Status   03/27/2022 Not Detected Not Detected Final          Vaccine-preventable illnesses  MMR & VZV screening  Declines assessment today    Vaccines needed           (CDC Adult Schedule)  patient declines recommended immunization(s)         No results found for: MEASIGG, MUMPSIGG, RUBIG, VZVIGG    Most Recent Immunizations   Administered Date(s) Administered    COVID-19 VACCINE,MRNA(MODERNA)(PF) 08/03/2020           Lab Results   Component Value Date    FINALDX  04/06/2020     A: Soft tissue, left neck, excision  - Epidermal cyst, pilar type    This electronic signature is attestation that the pathologist personally reviewed the submitted material(s) and the final diagnosis reflects that evaluation.                Oral health  has no  dentist  last exam - unknown  Referred to [facility] Dentist           Information above was last reviewed & updated: 12/02/24         I personally spent 38 minutes face-to-face and non-face-to-face in the care of this patient, which includes all pre, intra, and post visit time on the date of service.      Disposition  Next appointment: 5-6 months      Camellia Blaze, PA-C  [facility]   [address]  [place], [place].  [address]  Phone: [phone]   Fax: [phone]      Subjective      History of Present Illness  Kevin [name] is a 34 year old male who presents for routine HIV follow-up.    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 Recently, he was concerned about a recurrence due to feeling a bump, but it has since resolved without any drainage. He saw Dr. Fleurette yesterday and had a wart removed and biopsy of an area of his anus, with biopsy results pending. No recurrence of fistula or ongoing rectal pain or drainage.      He experiences occasional nerve pain (shooting, quick pain) in the area where the his recurrent abscess was near his jawline. It occurs once every few weeks and lasting a few seconds each time. The bump he had previously has gone away, and there has been no drainage. He's been letting his beard grow since mid-December.     He has gained weight, currently weighing 198 pounds, which is the most he has ever weighed. He attributes this to increased eating and drinking during the holiday season and his birthday month.     He had been going to the gym three times a week and limiting alcohol intake prior to November but has since fallen off this routine. He previously tried Wegovy  injections for weight management (through an online platform) but experienced significant nausea and vomiting, leading him to discontinue the treatment. He is considering trying Wegovy  (semaglutide ) pills.     He is currently on Biktarvy  for HIV management and reports no issues with this medication.     He does not have any urgent dental insurance and is considering utilizing [employer] program benefits for dental care at [facility].     He reports sleeping well without disturbances. He is generally managing  his anxiety and mood without medication at this time. He has not been on Effexor  for the past five to six months as it was not being sent to him, but he reports not noticing any significant difference in his mood without it. He describes feeling more anxious than depressed in the past, but not to a debilitating extent and is managing it well now.     Past Medical History[1]      Social History  Housing - in house in [place]. His family is in [place].     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    Allergies: Doxycycline       Family History  His family history includes Alzheimer's disease in his maternal grandfather and paternal grandfather; Crohn's disease in his father; Diabetes in his maternal grandmother; Heart failure in his maternal grandmother; Lymphoma in his paternal grandmother.           Objective      BP 121/88 (BP Site: L Arm, BP Position: Sitting, BP Cuff Size: Large)  - Pulse 99  - Temp 36.7 ??C (98 ??F) (Temporal)  - Ht 170.2 cm (5' 7)  - Wt 90 kg (198 lb 6.4 oz)  - BMI 31.07 kg/m??      Physical Exam  Vitals reviewed.   Constitutional:       Appearance: Normal appearance.   HENT:      Mouth/Throat:      Mouth: Mucous membranes are moist.      Pharynx: Oropharynx is clear.   Cardiovascular:      Rate and Rhythm: Normal rate and regular rhythm.   Pulmonary:      Effort: Pulmonary effort is normal.      Breath sounds: Normal breath sounds.   Lymphadenopathy:      Head:      Right side of head: No submental, submandibular, tonsillar, preauricular, posterior auricular or occipital adenopathy.      Left side of head: No submental, submandibular, tonsillar, preauricular, posterior auricular or occipital adenopathy.      Cervical: No cervical adenopathy.   Skin:     General: Skin is warm and dry.   Neurological:      General: No focal deficit present.      Mental Status: He is alert and oriented to person, place, and time.   Psychiatric:         Mood and Affect: Mood normal.         Behavior: Behavior normal.         Thought Content: Thought content normal.         Judgment: Judgment normal.                  [1]   Past Medical History:  Diagnosis Date    Anal fistula     History of COVID-19 2021    History of syphilis     HIV disease    (CMS-HCC)    [2]   Social History  Socioeconomic History    Marital status: Single   Tobacco Use    Smoking status: Former     Current packs/day: 0.00     Types: Cigarettes     Quit date: 2022     Years since quitting: 4.0  Smokeless tobacco: Never   Vaping Use    Vaping status: Every Day    Substances: Nicotine, THC    Devices: Disposable   Substance and Sexual Activity    Alcohol use: Yes     Alcohol/week: 16.0 standard drinks of alcohol     Types: 8 Glasses of wine, 8 Shots of liquor per week     Comment: weekends    Drug use: Yes     Frequency: 7.0 times per week     Types: Marijuana     Comment: smokes weed daily    Sexual activity: Yes     Partners: Male     Birth control/protection: Condom     Social Drivers of Health Food Insecurity: No Food Insecurity (03/10/2024)    Hunger Vital Sign     Worried About Running Out of Food in the Last Year: Never true     Ran Out of Food in the Last Year: Never true   Tobacco Use: Medium Risk (12/01/2024)    Patient History     Smoking Tobacco Use: Former     Smokeless Tobacco Use: Never   Transportation Needs: No Transportation Needs (03/10/2024)    PRAPARE - Therapist, Art (Medical): No     Lack of Transportation (Non-Medical): No   Housing: Low Risk (03/10/2024)    Housing     Within the past 12 months, have you ever stayed: outside, in a car, in a tent, in an overnight shelter, or temporarily in someone else's home (i.e. couch-surfing)?: No     Are you worried about losing your housing?: No   Utilities: Low Risk (03/10/2024)    Utilities     Within the past 12 months, have you been unable to get utilities (heat, electricity) when it was really needed?: No   Interpersonal Safety: Not At Risk (12/01/2024)    Interpersonal Safety     Unsafe Where You Currently Live: No     Physically Hurt by Anyone: No     Abused by Anyone: No

## 2024-12-02 NOTE — Addendum Note (Signed)
 Addended byBETHA BLAZE, Loray Akard on: 12/02/2024 09:50 PM     Modules accepted: Orders

## 2024-12-03 LAB — SYPHILIS SCREEN: SYPHILIS RPR SCREEN: NONREACTIVE

## 2024-12-04 LAB — HEPATITIS C ANTIBODY
HCV S/CO VALUE: 0.11
HEPATITIS C ANTIBODY: NONREACTIVE

## 2024-12-25 NOTE — Progress Notes (Signed)
 [facility] Refill Coordination Note    Specialty Medication(s) to be Shipped:   Infectious Disease: BIKTARVY  50-200-25 mg tablet (bictegrav-emtricit-tenofov ala)    Other medication(s) to be shipped: No additional medications requested for fill at this time    Specialty Medications not needed at this time: N/A     Kevin [name], DOB: 21-Oct-1991  Phone: [phone] (home)       All above HIPAA information was verified with patient's family member, Kevin [name].     Was a nurse, learning disability used for this call? No    Completed refill call assessment today to schedule patient's medication shipment from the [facility]  [phone]).  All relevant notes have been reviewed.     Specialty medication(s) and dose(s) confirmed: Regimen is correct and unchanged.   Changes to medications: Kevin [name] reports no changes at this time.  Changes to insurance: No  New side effects reported not previously addressed with a pharmacist or physician: None reported  Questions for the pharmacist: No    Confirmed patient received a Conservation Officer, Historic Buildings and a Surveyor, Mining with first shipment. The patient will receive a drug information handout for each medication shipped and additional FDA Medication Guides as required.       DISEASE/MEDICATION-SPECIFIC INFORMATION        N/A    SPECIALTY MEDICATION ADHERENCE     Medication Adherence    Patient reported X missed doses in the last month: 0  Specialty Medication: BIKTARVY  50-200-25 mg tablet (bictegrav-emtricit-tenofov ala)  Patient is on additional specialty medications: No  Informant: father              Were doses missed due to medication being on hold? No    BIKTARVY  50-200-25 mg tablet (bictegrav-emtricit-tenofov ala): 5 days of medicine on hand       Specialty medication is an injection or given on a cycle: No    REFERRAL TO PHARMACIST     Referral to the pharmacist: Not needed      [facility]     Shipping address confirmed in Epic.     Cost and Payment: Patient has a $0 copay, payment information is not required.    Delivery Scheduled: Yes, Expected medication delivery date: 2/10.     Medication will be delivered via Next Day Courier to the prescription address in Epic WAM.    [name]
